# Patient Record
Sex: Male | Born: 1960
Health system: Southern US, Community
[De-identification: ages and names within clinical notes are randomized; demographics above are authoritative.]

## PROBLEM LIST (undated history)

## (undated) DIAGNOSIS — I10 Essential (primary) hypertension: Secondary | ICD-10-CM

## (undated) DIAGNOSIS — E559 Vitamin D deficiency, unspecified: Secondary | ICD-10-CM

## (undated) DIAGNOSIS — E291 Testicular hypofunction: Secondary | ICD-10-CM

## (undated) DIAGNOSIS — H6990 Unspecified Eustachian tube disorder, unspecified ear: Secondary | ICD-10-CM

## (undated) HISTORY — DX: Unspecified eustachian tube disorder, unspecified ear: H69.90

## (undated) HISTORY — DX: Essential (primary) hypertension: I10

## (undated) HISTORY — DX: Vitamin D deficiency, unspecified: E55.9

## (undated) HISTORY — DX: Testicular hypofunction: E29.1

---
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
## 1999-03-13 HISTORY — PX: APPENDECTOMY: SHX54

## 2001-03-12 HISTORY — PX: NASAL SINUS SURGERY: SHX719

## 2005-06-06 ENCOUNTER — Other Ambulatory Visit: Payer: Self-pay

## 2005-06-06 ENCOUNTER — Emergency Department: Payer: Self-pay | Admitting: Internal Medicine

## 2005-06-08 DIAGNOSIS — J301 Allergic rhinitis due to pollen: Secondary | ICD-10-CM | POA: Insufficient documentation

## 2007-08-01 DIAGNOSIS — L259 Unspecified contact dermatitis, unspecified cause: Secondary | ICD-10-CM | POA: Insufficient documentation

## 2008-02-17 DIAGNOSIS — H6990 Unspecified Eustachian tube disorder, unspecified ear: Secondary | ICD-10-CM

## 2008-02-17 HISTORY — DX: Unspecified eustachian tube disorder, unspecified ear: H69.90

## 2008-04-26 DIAGNOSIS — Z8249 Family history of ischemic heart disease and other diseases of the circulatory system: Secondary | ICD-10-CM | POA: Insufficient documentation

## 2008-04-26 DIAGNOSIS — I10 Essential (primary) hypertension: Secondary | ICD-10-CM | POA: Insufficient documentation

## 2008-04-28 DIAGNOSIS — E7800 Pure hypercholesterolemia, unspecified: Secondary | ICD-10-CM | POA: Insufficient documentation

## 2009-03-12 DIAGNOSIS — E291 Testicular hypofunction: Secondary | ICD-10-CM

## 2009-03-12 HISTORY — DX: Testicular hypofunction: E29.1

## 2009-08-08 ENCOUNTER — Emergency Department: Payer: Self-pay | Admitting: Emergency Medicine

## 2009-08-09 ENCOUNTER — Ambulatory Visit: Payer: Self-pay | Admitting: Family Medicine

## 2009-12-07 ENCOUNTER — Emergency Department (HOSPITAL_COMMUNITY): Admission: EM | Admit: 2009-12-07 | Discharge: 2009-12-07 | Payer: Self-pay | Admitting: Emergency Medicine

## 2010-02-14 ENCOUNTER — Emergency Department: Payer: Self-pay | Admitting: Internal Medicine

## 2010-03-01 ENCOUNTER — Ambulatory Visit: Payer: Self-pay | Admitting: Family Medicine

## 2010-05-08 ENCOUNTER — Ambulatory Visit: Payer: Self-pay | Admitting: Family Medicine

## 2010-08-26 ENCOUNTER — Other Ambulatory Visit: Payer: Self-pay | Admitting: Gastroenterology

## 2010-08-26 DIAGNOSIS — K648 Other hemorrhoids: Secondary | ICD-10-CM

## 2010-08-26 MED ORDER — HYDROCORTISONE ACETATE 25 MG RE SUPP: 25.0000 mg | Freq: Two times a day (BID) | RECTAL | Status: AC

## 2015-07-29 ENCOUNTER — Ambulatory Visit
Admission: RE | Admit: 2015-07-29 | Discharge: 2015-07-29 | Disposition: A | Discharge status: Home / Self Care | Payer: Federal, State, Local not specified - PPO | Source: Ambulatory Visit | Attending: Family Medicine | Admitting: Family Medicine

## 2015-07-29 ENCOUNTER — Encounter: Payer: Self-pay | Admitting: *Deleted

## 2015-07-29 ENCOUNTER — Ambulatory Visit (INDEPENDENT_AMBULATORY_CARE_PROVIDER_SITE_OTHER): Payer: Federal, State, Local not specified - PPO | Admitting: Family Medicine

## 2015-07-29 ENCOUNTER — Encounter: Payer: Self-pay | Admitting: Family Medicine

## 2015-07-29 ENCOUNTER — Other Ambulatory Visit: Payer: Self-pay | Admitting: *Deleted

## 2015-07-29 VITALS — BP 144/90 | HR 90 | Temp 98.4°F | Resp 16 | Wt 208.0 lb

## 2015-07-29 DIAGNOSIS — J301 Allergic rhinitis due to pollen: Secondary | ICD-10-CM | POA: Diagnosis not present

## 2015-07-29 DIAGNOSIS — R109 Unspecified abdominal pain: Secondary | ICD-10-CM

## 2015-07-29 DIAGNOSIS — R42 Dizziness and giddiness: Secondary | ICD-10-CM | POA: Insufficient documentation

## 2015-07-29 DIAGNOSIS — E559 Vitamin D deficiency, unspecified: Secondary | ICD-10-CM | POA: Insufficient documentation

## 2015-07-29 DIAGNOSIS — R079 Chest pain, unspecified: Secondary | ICD-10-CM | POA: Insufficient documentation

## 2015-07-29 DIAGNOSIS — E291 Testicular hypofunction: Secondary | ICD-10-CM

## 2015-07-29 DIAGNOSIS — R1012 Left upper quadrant pain: Secondary | ICD-10-CM | POA: Insufficient documentation

## 2015-07-29 DIAGNOSIS — I10 Essential (primary) hypertension: Secondary | ICD-10-CM | POA: Insufficient documentation

## 2015-07-29 DIAGNOSIS — J329 Chronic sinusitis, unspecified: Secondary | ICD-10-CM | POA: Diagnosis not present

## 2015-07-29 DIAGNOSIS — G4710 Hypersomnia, unspecified: Secondary | ICD-10-CM | POA: Insufficient documentation

## 2015-07-29 DIAGNOSIS — R10A0 Flank pain, unspecified side: Secondary | ICD-10-CM

## 2015-07-29 LAB — POCT URINALYSIS DIPSTICK
BILIRUBIN UA: NEGATIVE
Glucose, UA: NEGATIVE
Leukocytes, UA: NEGATIVE
Nitrite, UA: NEGATIVE
PH UA: 7.5
Spec Grav, UA: 1.01
Urobilinogen, UA: NEGATIVE

## 2015-07-29 MED ORDER — PREDNISONE 10 MG PO TABS: ORAL_TABLET | ORAL | Status: AC

## 2015-07-29 MED ORDER — AMOXICILLIN 500 MG PO CAPS: 1000.0000 mg | ORAL_CAPSULE | Freq: Two times a day (BID) | ORAL | Status: DC

## 2015-07-29 NOTE — Patient Instructions (Signed)
Go to the [facility] on [address] for Abdominal Xray

## 2015-07-29 NOTE — Progress Notes (Signed)
Patient: Alex Patterson Male    DOB: 1960/12/08   55 y.o.   MRN: [id number] Visit Date: 07/29/2015  Today's Provider: Mila Merryonald Miquel Stacks, MD   Chief Complaint  Patient presents with  . Sinusitis  . Fever  . Sore Throat   Subjective:    Sinusitis This is a new problem. The current episode started in the past 7 days (5 days). The problem has been gradually worsening since onset. There has been no fever. Associated symptoms include congestion, coughing and sinus pressure. Pertinent negatives include no chills, diaphoresis, shortness of breath or sneezing. Past treatments include spray decongestants (decongestant). The treatment provided mild relief.  Patient has had sinus pressure for 5 days. Symptoms of sore throat, sore neck, cough, and flushing. Has history of allergies and just started back on Flonase and Claritin this week.    Patient has also had left flank pain for a few weeks. Always seems to be a dull pain in left side.A little worse with activity. Not related to eating or drinking. Had lipoma excised from area several years ago.    No Known Allergies Previous Medications   CETIRIZINE (ZYRTEC ALLERGY) 10 MG TABLET    Take 1 tablet by mouth daily. Reported on 07/29/2015   FLUTICASONE (FLONASE ALLERGY RELIEF) 50 MCG/ACT NASAL SPRAY    Place 2 sprays into the nose daily.    Review of Systems  Constitutional: Negative for chills, diaphoresis and appetite change.  HENT: Positive for congestion, postnasal drip, sinus pressure and tinnitus. Negative for sneezing and trouble swallowing.   Respiratory: Positive for cough. Negative for chest tightness, shortness of breath and wheezing.   Cardiovascular: Negative for palpitations.    Social History  Substance Use Topics  . Smoking status: Never Smoker   . Smokeless tobacco: Not on file  . Alcohol Use: No   Objective:   BP 144/90 mmHg  Pulse 90  Temp(Src) 98.4 F (36.9 C) (Oral)  Resp 16  Wt 208 lb (94.348 kg)  SpO2  98%  Physical Exam  General Appearance:    Alert, cooperative, no distress  HENT:   bilateral TM normal without fluid or infection, neck without nodes, pharynx erythematous without exudate, post nasal drip noted and nasal mucosa congested  Eyes:    PERRL, conjunctiva/corneas clear, EOM's intact       Lungs:     Clear to auscultation bilaterally, respirations unlabored  Heart:    Regular rate and rhythm  Neurologic:   Awake, alert, oriented x 3. No apparent focal neurological           defect.    Abdomen:   bowel sounds present and normal in all 4 quadrants. No CVA tenderness. Slight tenderness of intercostal area around 7th-9th ribs on left side. No swelling, erythema, or other gross deformities.    Results for orders placed or performed in visit on 07/29/15  POCT urinalysis dipstick  Result Value Ref Range   Color, UA Amber    Clarity, UA Clear    Glucose, UA Neg    Bilirubin, UA Neg    Ketones, UA Trace    Spec Grav, UA 1.010    Blood, UA Trace    pH, UA 7.5    Protein, UA Trace    Urobilinogen, UA negative    Nitrite, UA Neg    Leukocytes, UA Negative Negative        Assessment & Plan:     1. Flank pain Trace or urine  on u/a. Need to rule out stone and intrinsic kidney disease. Consider u/s if KUB is normal. Most likely explanation is intercostal muscle strain - POCT urinalysis dipstick - DG Abd 1 View; Future  2. Allergic rhinitis due to pollen, unspecified rhinitis seasonality Recently back on Claritin, put on short course of prednisone - predniSONE (DELTASONE) 10 MG tablet; 6 tablets for 1 day, then 5 for 1 day, then 4 for 1 day, then 3 for 1 day, then 2 for 1 day then 1 for 1 day.  Dispense: 21 tablet; Refill: 0  3. Sinusitis, unspecified chronicity, unspecified location Given option of starting antibiotic now, or waiting to see if sx clear with prednisone as above.  - amoxicillin (AMOXIL) 500 MG capsule; Take 2 capsules (1,000 mg total) by mouth 2 (two) times  daily.  Dispense: 28 capsule; Refill: 0      The entirety of the information documented in the History of Present Illness, Review of Systems and Physical Exam were personally obtained by me. Portions of this information were initially documented by April M. Hyacinth Meeker, CMA and reviewed by me for thoroughness and accuracy.   Mila Merry, MD  [facility]

## 2015-08-01 ENCOUNTER — Other Ambulatory Visit: Payer: Self-pay

## 2015-08-01 DIAGNOSIS — R109 Unspecified abdominal pain: Secondary | ICD-10-CM

## 2015-08-01 DIAGNOSIS — R1012 Left upper quadrant pain: Secondary | ICD-10-CM

## 2015-08-01 NOTE — Progress Notes (Signed)
Patient advised and order put in chart and sent to referrals.

## 2015-08-04 ENCOUNTER — Ambulatory Visit
Admission: RE | Admit: 2015-08-04 | Discharge: 2015-08-04 | Disposition: A | Discharge status: Home / Self Care | Payer: Federal, State, Local not specified - PPO | Source: Ambulatory Visit | Attending: Family Medicine | Admitting: Family Medicine

## 2015-08-04 ENCOUNTER — Telehealth: Payer: Self-pay

## 2015-08-04 DIAGNOSIS — R1012 Left upper quadrant pain: Secondary | ICD-10-CM

## 2015-08-04 DIAGNOSIS — R109 Unspecified abdominal pain: Secondary | ICD-10-CM | POA: Insufficient documentation

## 2015-08-04 NOTE — Telephone Encounter (Signed)
Left message to call back  

## 2015-08-04 NOTE — Telephone Encounter (Signed)
-----   Message from Malva Limesonald E Fisher, MD sent at 08/04/2015  1:12 PM EDT ----- Everything is normal on ultrasound. Normal gallbladder, liver, spleen, stomach and kidneys. Pain is most like muscle or tendon strain and should resolve in a few weeks. Let me know if it doesn't

## 2015-08-05 NOTE — Telephone Encounter (Signed)
Pt advised-aa 

## 2015-08-11 ENCOUNTER — Encounter: Payer: Self-pay | Admitting: Family Medicine

## 2016-10-14 ENCOUNTER — Ambulatory Visit
Admission: EM | Admit: 2016-10-14 | Discharge: 2016-10-14 | Discharge status: Against Medical Advice | Payer: Federal, State, Local not specified - PPO

## 2016-10-15 DIAGNOSIS — H43812 Vitreous degeneration, left eye: Secondary | ICD-10-CM | POA: Diagnosis not present

## 2017-02-11 ENCOUNTER — Telehealth: Payer: Self-pay | Admitting: Family Medicine

## 2017-03-13 ENCOUNTER — Encounter: Payer: Self-pay | Admitting: Family Medicine

## 2017-03-13 ENCOUNTER — Ambulatory Visit (INDEPENDENT_AMBULATORY_CARE_PROVIDER_SITE_OTHER): Payer: Federal, State, Local not specified - PPO | Admitting: Family Medicine

## 2017-03-13 VITALS — BP 144/88 | HR 60 | Temp 98.5°F | Resp 16 | Ht 67.0 in | Wt 208.0 lb

## 2017-03-13 DIAGNOSIS — Z23 Encounter for immunization: Secondary | ICD-10-CM | POA: Diagnosis not present

## 2017-03-13 DIAGNOSIS — E291 Testicular hypofunction: Secondary | ICD-10-CM | POA: Diagnosis not present

## 2017-03-13 DIAGNOSIS — Z0000 Encounter for general adult medical examination without abnormal findings: Secondary | ICD-10-CM

## 2017-03-13 DIAGNOSIS — R3915 Urgency of urination: Secondary | ICD-10-CM

## 2017-03-13 DIAGNOSIS — R030 Elevated blood-pressure reading, without diagnosis of hypertension: Secondary | ICD-10-CM | POA: Diagnosis not present

## 2017-03-13 DIAGNOSIS — Z1159 Encounter for screening for other viral diseases: Secondary | ICD-10-CM | POA: Diagnosis not present

## 2017-03-13 DIAGNOSIS — N529 Male erectile dysfunction, unspecified: Secondary | ICD-10-CM | POA: Diagnosis not present

## 2017-03-13 DIAGNOSIS — Z1211 Encounter for screening for malignant neoplasm of colon: Secondary | ICD-10-CM

## 2017-03-13 LAB — POCT URINALYSIS DIPSTICK
Bilirubin, UA: NEGATIVE
GLUCOSE UA: NEGATIVE
Ketones, UA: NEGATIVE
LEUKOCYTES UA: NEGATIVE
NITRITE UA: NEGATIVE
PROTEIN UA: NEGATIVE
RBC UA: NEGATIVE
SPEC GRAV UA: 1.025 (ref 1.010–1.025)
Urobilinogen, UA: 0.2 E.U./dL
pH, UA: 6.5 (ref 5.0–8.0)

## 2017-03-13 MED ORDER — SILDENAFIL CITRATE 50 MG PO TABS: 50.0000 mg | ORAL_TABLET | Freq: Every day | ORAL | 2 refills | Status: DC | PRN

## 2017-03-13 NOTE — Progress Notes (Signed)
Patient: Alex Patterson, Male    DOB: June 03, 1960, 57 y.o.   MRN: [id number] Visit Date: 03/13/2017  Today's Provider: Mila Merry, MD   Chief Complaint  Patient presents with  . Annual Exam  . Allergic Rhinitis    Subjective:    Annual physical exam Alex Patterson is a 57 y.o. male who presents today for health maintenance and complete physical. He feels fairly well. He reports exercising not regularly, but he does stay active. He reports he is sleeping well.   He is interested in medication for ED. He has history of low testosterone and had brief trial of testosterone gel several years ago. He didn't notice much difference in how he felt when he was taking it, but was thinking about trying injectable testosterone instead. Otherwise feel well. Feels allergies are adequately controlled with prn use of OTC nasal steroids and antihistamines.    Review of Systems  Constitutional: Negative.  Negative for chills, diaphoresis and fever.  HENT: Negative.  Negative for congestion, ear discharge, ear pain, hearing loss, nosebleeds, sore throat and tinnitus.   Eyes: Negative.  Negative for photophobia, pain, discharge and redness.  Respiratory: Negative.  Negative for cough, shortness of breath, wheezing and stridor.   Cardiovascular: Negative.  Negative for chest pain, palpitations and leg swelling.  Gastrointestinal: Negative.  Negative for abdominal pain, blood in stool, constipation, diarrhea, nausea and vomiting.  Endocrine: Negative.  Negative for polydipsia.  Genitourinary: Positive for urgency (no nocturia). Negative for difficulty urinating, dysuria, flank pain, frequency and hematuria.  Musculoskeletal: Negative for back pain, myalgias and neck pain.  Skin: Negative.  Negative for rash.  Allergic/Immunologic: Negative.  Negative for environmental allergies.  Neurological: Negative.  Negative for dizziness, tremors, seizures, weakness and headaches.  Hematological:  Negative.  Does not bruise/bleed easily.  Psychiatric/Behavioral: Negative.  Negative for hallucinations and suicidal ideas. The patient is not nervous/anxious.     Social History      He  reports that  has never smoked. he has never used smokeless tobacco. He reports that he does not drink alcohol or use drugs.       Social History   Socioeconomic History  . Marital status: Married    Spouse name: None  . Number of children: None  . Years of education: [employer]  . Highest education level: None  Social Needs  . Financial resource strain: None  . Food insecurity - worry: None  . Food insecurity - inability: None  . Transportation needs - medical: None  . Transportation needs - non-medical: None  Occupational History  . Occupation: Audiological scientist  Tobacco Use  . Smoking status: Never Smoker  . Smokeless tobacco: Never Used  Substance and Sexual Activity  . Alcohol use: No  . Drug use: No  . Sexual activity: None  Other Topics Concern  . None  Social History Narrative  . None    Past Medical History:  Diagnosis Date  . Disorder of eustachian tube   . Disorder of eustachian tube 02/17/2008  . Hypogonadism male   . Hypogonadism male 03/12/2009   04/25/10 Testosterone, Serum=214 ng/dL (Abn: L) [id number] Free Testosterone(Direct)= 3.9 pg/mL (abn: L) n=6.8-21.5    . Vitamin D deficiency      Patient Active Problem List   Diagnosis Date Noted  . Vitamin D deficiency 07/29/2015  . Pure hypercholesterolemia 04/28/2008  . Family history of ischemic heart disease 04/26/2008  . Elevated blood-pressure reading without diagnosis of  hypertension 04/26/2008  . Allergic rhinitis due to pollen 06/08/2005    Past Surgical History:  Procedure Laterality Date  . APPENDECTOMY  2001  . NASAL SINUS SURGERY  2003  . undescended left testicle  1973    Family History        Family Status  Relation Name Status  . Mother  Deceased  . Father  Deceased  . Sister  Alive         His family history includes Lung cancer in his mother; Stroke in his father.     No Known Allergies   Current Outpatient Medications:  .  cetirizine (ZYRTEC ALLERGY) 10 MG tablet, Take 1 tablet by mouth daily. Reported on 07/29/2015, Disp: , Rfl:  .  fluticasone (FLONASE ALLERGY RELIEF) 50 MCG/ACT nasal spray, Place 2 sprays into the nose daily., Disp: , Rfl:    Patient Care Team: Malva LimesFisher, Donald E, MD as PCP - General (Family Medicine)      Objective:   Vitals: BP (!) 144/88 (BP Location: Right Arm, Patient Position: Sitting, Cuff Size: Normal)   Pulse 60   Temp 98.5 F (36.9 C)   Resp 16   Ht 5\' 7"  (1.702 m)   Wt 208 lb (94.3 kg)   SpO2 97%   BMI 32.58 kg/m    Vitals:   03/13/17 1006  BP: (!) 144/88  Pulse: 60  Resp: 16  Temp: 98.5 F (36.9 C)  SpO2: 97%  Weight: 208 lb (94.3 kg)  Height: 5\' 7"  (1.702 m)     Physical Exam   General Appearance:    Alert, cooperative, no distress, appears stated age, overweight  Head:    Normocephalic, without obvious abnormality, atraumatic  Eyes:    PERRL, conjunctiva/corneas clear, EOM's intact, fundi    benign, both eyes       Ears:    Normal TM's and external ear canals, both ears  Nose:   Nares normal, septum midline, mucosa normal, no drainage   or sinus tenderness  Throat:   Lips, mucosa, and tongue normal; teeth and gums normal  Neck:   Supple, symmetrical, trachea midline, no adenopathy;       thyroid:  No enlargement/tenderness/nodules; no carotid   bruit or JVD  Back:     Symmetric, no curvature, ROM normal, no CVA tenderness  Lungs:     Clear to auscultation bilaterally, respirations unlabored  Chest wall:    No tenderness or deformity  Heart:    Regular rate and rhythm, S1 and S2 normal, no murmur, rub   or gallop  Abdomen:     Soft, non-tender, bowel sounds active all four quadrants,    no masses, no organomegaly  Genitalia:    deferred  Rectal:    deferred  Extremities:   Extremities normal, atraumatic, no  cyanosis or edema  Pulses:   2+ and symmetric all extremities  Skin:   Skin color, texture, turgor normal, no rashes or lesions  Lymph nodes:   Cervical, supraclavicular, and axillary nodes normal  Neurologic:   CNII-XII intact. Normal strength, sensation and reflexes      throughout    Depression Screen PHQ 2/9 Scores 03/13/2017  PHQ - 2 Score 0      Assessment & Plan:     Routine Health Maintenance and Physical Exam  Exercise Activities and Dietary recommendations Goals    None      Immunization History  Administered Date(s) Administered  . Td 04/12/1996  . Tdap 03/19/2011  Health Maintenance  Topic Date Due  . Hepatitis C Screening  05/27/1960  . HIV Screening  03/10/1978  . COLONOSCOPY  03/10/2013  . INFLUENZA VACCINE  06/09/2017 (Originally 10/10/2016)  . TETANUS/TDAP  03/18/2021     Discussed health benefits of physical activity, and encouraged him to engage in regular exercise appropriate for his age and condition.     1. Annual physical exam  - POCT urinalysis dipstick - Lipid panel - PSA - TSH - Comprehensive metabolic panel  2. Urinary urgency He is not particularly bothered by this, feels it is normal for his age.  - POCT urinalysis dipstick  3. Hypogonadism male Previously on androgel, but he prefers to try injectable testosterone if levels are still low.  - Testosterone,Free and Total  4. Colon cancer screening  - Ambulatory referral to Gastroenterology  5. Elevated blood pressure reading Counseled regarding prudent diet and regular exercise. Try to lose 10% of bodyweight over the next 6 months.  If labs are normal should return in 4-6 months for BP check.  - Lipid panel - PSA - TSH - Comprehensive metabolic panel  6. Need for hepatitis C screening test  - Hepatitis C antibody  7. Need for shingles vaccine  - Varicella-zoster vaccine IM Return 2 months for Shingrix #2.   8. ED Try sildenafil 50mg  1-2 daily prn.   Mila Merry, MD  [facility]

## 2017-03-13 NOTE — Patient Instructions (Addendum)
I recommend that you get a flu vaccine this year. Please call our office at [phone] at your earliest convenience to schedule a flu shot.   You should see a 10 point drop in your blood pressure if you can lose 5-10% of you bodyweight.   Preventive Care 40-64 Years, Male Preventive care refers to lifestyle choices and visits with your health care provider that can promote health and wellness. What does preventive care include?  A yearly physical exam. This is also called an annual well check.  Dental exams once or twice a year.  Routine eye exams. Ask your health care provider how often you should have your eyes checked.  Personal lifestyle choices, including: ? Daily care of your teeth and gums. ? Regular physical activity. ? Eating a healthy diet. ? Avoiding tobacco and drug use. ? Limiting alcohol use. ? Practicing safe sex. ? Taking low-dose aspirin every day starting at age 46. What happens during an annual well check? The services and screenings done by your health care provider during your annual well check will depend on your age, overall health, lifestyle risk factors, and family history of disease. Counseling Your health care provider may ask you questions about your:  Alcohol use.  Tobacco use.  Drug use.  Emotional well-being.  Home and relationship well-being.  Sexual activity.  Eating habits.  Work and work Statistician.  Screening You may have the following tests or measurements:  Height, weight, and BMI.  Blood pressure.  Lipid and cholesterol levels. These may be checked every 5 years, or more frequently if you are over 69 years old.  Skin check.  Lung cancer screening. You may have this screening every year starting at age 15 if you have a 30-pack-year history of smoking and currently smoke or have quit within the past 15 years.  Fecal occult blood test (FOBT) of the stool. You may have this test every year starting at age 90.  Flexible  sigmoidoscopy or colonoscopy. You may have a sigmoidoscopy every 5 years or a colonoscopy every 10 years starting at age 46.  Prostate cancer screening. Recommendations will vary depending on your family history and other risks.  Hepatitis C blood test.  Hepatitis B blood test.  Sexually transmitted disease (STD) testing.  Diabetes screening. This is done by checking your blood sugar (glucose) after you have not eaten for a while (fasting). You may have this done every 1-3 years.  Discuss your test results, treatment options, and if necessary, the need for more tests with your health care provider. Vaccines Your health care provider may recommend certain vaccines, such as:  Influenza vaccine. This is recommended every year.  Tetanus, diphtheria, and acellular pertussis (Tdap, Td) vaccine. You may need a Td booster every 10 years.  Varicella vaccine. You may need this if you have not been vaccinated.  Zoster vaccine. You may need this after age 67.  Measles, mumps, and rubella (MMR) vaccine. You may need at least one dose of MMR if you were born in 1957 or later. You may also need a second dose.  Pneumococcal 13-valent conjugate (PCV13) vaccine. You may need this if you have certain conditions and have not been vaccinated.  Pneumococcal polysaccharide (PPSV23) vaccine. You may need one or two doses if you smoke cigarettes or if you have certain conditions.  Meningococcal vaccine. You may need this if you have certain conditions.  Hepatitis A vaccine. You may need this if you have certain conditions or if you  travel or work in places where you may be exposed to hepatitis A.  Hepatitis B vaccine. You may need this if you have certain conditions or if you travel or work in places where you may be exposed to hepatitis B.  Haemophilus influenzae type b (Hib) vaccine. You may need this if you have certain risk factors.  Talk to your health care provider about which screenings and  vaccines you need and how often you need them. This information is not intended to replace advice given to you by your health care provider. Make sure you discuss any questions you have with your health care provider. Document Released: 03/25/2015 Document Revised: 11/16/2015 Document Reviewed: 12/28/2014 Elsevier Interactive Patient Education  [employer].

## 2017-03-14 DIAGNOSIS — R030 Elevated blood-pressure reading, without diagnosis of hypertension: Secondary | ICD-10-CM | POA: Diagnosis not present

## 2017-03-14 DIAGNOSIS — E291 Testicular hypofunction: Secondary | ICD-10-CM | POA: Diagnosis not present

## 2017-03-14 DIAGNOSIS — Z1159 Encounter for screening for other viral diseases: Secondary | ICD-10-CM | POA: Diagnosis not present

## 2017-03-14 DIAGNOSIS — Z0000 Encounter for general adult medical examination without abnormal findings: Secondary | ICD-10-CM | POA: Diagnosis not present

## 2017-03-15 LAB — COMPREHENSIVE METABOLIC PANEL
ALK PHOS: 72 IU/L (ref 39–117)
ALT: 23 IU/L (ref 0–44)
AST: 20 IU/L (ref 0–40)
Albumin/Globulin Ratio: 2 (ref 1.2–2.2)
Albumin: 4.3 g/dL (ref 3.5–5.5)
BILIRUBIN TOTAL: 0.5 mg/dL (ref 0.0–1.2)
BUN/Creatinine Ratio: 13 (ref 9–20)
BUN: 14 mg/dL (ref 6–24)
CHLORIDE: 104 mmol/L (ref 96–106)
CO2: 21 mmol/L (ref 20–29)
Calcium: 9.1 mg/dL (ref 8.7–10.2)
Creatinine, Ser: 1.12 mg/dL (ref 0.76–1.27)
GFR calc Af Amer: 86 mL/min/{1.73_m2} (ref >59)
GFR calc non Af Amer: 74 mL/min/{1.73_m2} (ref >59)
GLOBULIN, TOTAL: 2.2 g/dL (ref 1.5–4.5)
GLUCOSE: 105 mg/dL — AB (ref 65–99)
Potassium: 4 mmol/L (ref 3.5–5.2)
SODIUM: 142 mmol/L (ref 134–144)
Total Protein: 6.5 g/dL (ref 6.0–8.5)

## 2017-03-15 LAB — LIPID PANEL
Chol/HDL Ratio: 4 ratio (ref 0.0–5.0)
Cholesterol, Total: 214 mg/dL — ABNORMAL HIGH (ref 100–199)
HDL: 53 mg/dL (ref >39)
LDL CALC: 149 mg/dL — AB (ref 0–99)
Triglycerides: 61 mg/dL (ref 0–149)
VLDL Cholesterol Cal: 12 mg/dL (ref 5–40)

## 2017-03-15 LAB — PSA: PROSTATE SPECIFIC AG, SERUM: 1.9 ng/mL (ref 0.0–4.0)

## 2017-03-15 LAB — TSH: TSH: 3.02 u[IU]/mL (ref 0.450–4.500)

## 2017-03-15 LAB — TESTOSTERONE,FREE AND TOTAL
TESTOSTERONE: 406 ng/dL (ref 264–916)
Testosterone, Free: 7.2 pg/mL (ref 7.2–24.0)

## 2017-03-15 LAB — HEPATITIS C ANTIBODY

## 2017-03-19 ENCOUNTER — Encounter: Payer: Self-pay | Admitting: Family Medicine

## 2017-03-19 DIAGNOSIS — N529 Male erectile dysfunction, unspecified: Secondary | ICD-10-CM | POA: Insufficient documentation

## 2017-04-01 ENCOUNTER — Telehealth: Payer: Self-pay

## 2017-04-01 NOTE — Telephone Encounter (Signed)
Gastroenterology Pre-Procedure Review  Request Date:   Requesting Physician: Dr.    PATIENT REVIEW QUESTIONS: The patient responded to the following health history questions as indicated:    1. Are you having any GI issues? No  2. Do you have a personal history of Polyps? No  3. Do you have a family history of Colon Cancer or Polyps? No  4. Diabetes Mellitus? No  5. Joint replacements in the past 12 months? No  6. Major health problems in the past 3 months? No  7. Any artificial heart valves, MVP, or defibrillator? No     MEDICATIONS & ALLERGIES:    Patient reports the following regarding taking any anticoagulation/antiplatelet therapy:   Plavix, Coumadin, Eliquis, Xarelto, Lovenox, Pradaxa, Brilinta, or Effient? No  Aspirin? No   Patient confirms/reports the following medications:  Current Outpatient Medications  Medication Sig Dispense Refill  . cetirizine (ZYRTEC ALLERGY) 10 MG tablet Take 1 tablet by mouth daily. Reported on 07/29/2015    . fluticasone (FLONASE ALLERGY RELIEF) 50 MCG/ACT nasal spray Place 2 sprays into the nose daily.    . sildenafil (VIAGRA) 50 MG tablet Take 1-2 tablets (50-100 mg total) by mouth daily as needed. 8 tablet 2   No current facility-administered medications for this visit.     Patient confirms/reports the following allergies:  No Known Allergies  No orders of the defined types were placed in this encounter.   AUTHORIZATION INFORMATION Primary Insurance: 1D#: Group #:  Secondary Insurance: 1D#: Group #:  SCHEDULE INFORMATION: Date: 04/23/17 Time: Location: [facility]

## 2017-04-03 ENCOUNTER — Other Ambulatory Visit: Payer: Self-pay

## 2017-04-03 DIAGNOSIS — Z1211 Encounter for screening for malignant neoplasm of colon: Secondary | ICD-10-CM

## 2017-05-10 ENCOUNTER — Ambulatory Visit
Admission: RE | Admit: 2017-05-10 | Discharge: 2017-05-10 | Disposition: A | Discharge status: Home / Self Care | Payer: Federal, State, Local not specified - PPO | Source: Ambulatory Visit | Attending: Gastroenterology | Admitting: Gastroenterology

## 2017-05-10 ENCOUNTER — Emergency Department
Admission: EM | Admit: 2017-05-10 | Discharge: 2017-05-10 | Disposition: A | Discharge status: Home / Self Care | Payer: Federal, State, Local not specified - PPO | Source: Home / Self Care | Attending: Emergency Medicine | Admitting: Emergency Medicine

## 2017-05-10 ENCOUNTER — Other Ambulatory Visit: Payer: Self-pay

## 2017-05-10 ENCOUNTER — Encounter
Admission: RE | Disposition: A | Discharge status: Home / Self Care | Payer: Self-pay | Source: Ambulatory Visit | Attending: Gastroenterology

## 2017-05-10 ENCOUNTER — Encounter: Payer: Self-pay | Admitting: *Deleted

## 2017-05-10 ENCOUNTER — Encounter: Payer: Self-pay | Admitting: Emergency Medicine

## 2017-05-10 DIAGNOSIS — I10 Essential (primary) hypertension: Secondary | ICD-10-CM

## 2017-05-10 DIAGNOSIS — Z5309 Procedure and treatment not carried out because of other contraindication: Secondary | ICD-10-CM | POA: Diagnosis not present

## 2017-05-10 DIAGNOSIS — Z8249 Family history of ischemic heart disease and other diseases of the circulatory system: Secondary | ICD-10-CM | POA: Insufficient documentation

## 2017-05-10 DIAGNOSIS — Z1211 Encounter for screening for malignant neoplasm of colon: Secondary | ICD-10-CM | POA: Diagnosis not present

## 2017-05-10 LAB — CBC WITH DIFFERENTIAL/PLATELET
Basophils Absolute: 0 10*3/uL (ref 0–0.1)
Basophils Relative: 1 %
EOS ABS: 0.1 10*3/uL (ref 0–0.7)
Eosinophils Relative: 1 %
HEMATOCRIT: 42.4 % (ref 40.0–52.0)
HEMOGLOBIN: 14.4 g/dL (ref 13.0–18.0)
LYMPHS ABS: 1.3 10*3/uL (ref 1.0–3.6)
LYMPHS PCT: 24 %
MCH: 27.6 pg (ref 26.0–34.0)
MCHC: 34.1 g/dL (ref 32.0–36.0)
MCV: 81 fL (ref 80.0–100.0)
MONOS PCT: 7 %
Monocytes Absolute: 0.4 10*3/uL (ref 0.2–1.0)
NEUTROS ABS: 3.5 10*3/uL (ref 1.4–6.5)
NEUTROS PCT: 67 %
Platelets: 215 10*3/uL (ref 150–440)
RBC: 5.23 MIL/uL (ref 4.40–5.90)
RDW: 13.2 % (ref 11.5–14.5)
WBC: 5.3 10*3/uL (ref 3.8–10.6)

## 2017-05-10 LAB — COMPREHENSIVE METABOLIC PANEL
ALK PHOS: 69 U/L (ref 38–126)
ALT: 22 U/L (ref 17–63)
ANION GAP: 8 (ref 5–15)
AST: 24 U/L (ref 15–41)
Albumin: 4 g/dL (ref 3.5–5.0)
BUN: 10 mg/dL (ref 6–20)
CALCIUM: 8.5 mg/dL — AB (ref 8.9–10.3)
CO2: 25 mmol/L (ref 22–32)
Chloride: 108 mmol/L (ref 101–111)
Creatinine, Ser: 0.99 mg/dL (ref 0.61–1.24)
Glucose, Bld: 94 mg/dL (ref 65–99)
Potassium: 4 mmol/L (ref 3.5–5.1)
Sodium: 141 mmol/L (ref 135–145)
TOTAL PROTEIN: 7.1 g/dL (ref 6.5–8.1)
Total Bilirubin: 0.9 mg/dL (ref 0.3–1.2)

## 2017-05-10 LAB — TROPONIN I: Troponin I: <0.03 ng/mL (ref <0.03)

## 2017-05-10 SURGERY — COLONOSCOPY WITH PROPOFOL
Anesthesia: General

## 2017-05-10 MED ORDER — MIDAZOLAM HCL 2 MG/2ML IJ SOLN
INTRAMUSCULAR | Status: AC
  Filled 2017-05-10: qty 2

## 2017-05-10 MED ORDER — SODIUM CHLORIDE 0.9 % IV SOLN
INTRAVENOUS | Status: DC
  Administered 2017-05-10: 10:00:00 via INTRAVENOUS

## 2017-05-10 MED ORDER — MIDAZOLAM HCL 2 MG/2ML IJ SOLN: 2.0000 mg | Freq: Once | INTRAMUSCULAR | Status: DC

## 2017-05-10 MED ORDER — AMLODIPINE BESYLATE 5 MG PO TABS
5.0000 mg | ORAL_TABLET | Freq: Once | ORAL | Status: AC
  Administered 2017-05-10: 5 mg via ORAL
  Filled 2017-05-10: qty 1

## 2017-05-10 MED ORDER — AMLODIPINE BESYLATE 5 MG PO TABS: 5.0000 mg | ORAL_TABLET | Freq: Every day | ORAL | 1 refills | Status: DC

## 2017-05-10 NOTE — ED Notes (Signed)
First nurse note   Presents with elevated b/p from Gi

## 2017-05-10 NOTE — ED Notes (Signed)
Pt stating that he has had issues with his BP in the past but was taken off his medications because it was better. Pt denying any CP, HA, or visual changes. Pt was sent here after having a high BP reading in procedures. Pt was supposed to have a colonoscopy today but they sent him here "because it was to dangerous" to have the procedure done today. Pt in NAD at this time.

## 2017-05-10 NOTE — Discharge Instructions (Signed)
As we discussed, though you do have high blood pressure (hypertension), fortunately it is not immediately dangerous at this time and does not need emergency intervention or admission to the hospital.  Given that it has been consistently higher than your usual blood pressures, we discussed it and agree to start you on a blood pressure medication called amlodipine.  Please take it as prescribed and follow up in clinic as recommended in these papers.    Return to the Emergency Department (ED) if you experience any worsening chest pain/pressure/tightness, difficulty breathing, or sudden sweating, or other symptoms that concern you.

## 2017-05-10 NOTE — ED Triage Notes (Signed)
Pt here from endo.  Was there for routine colonoscopy and when they took his BP was elevated and was 213 systolic so they sent pt to ED.  Pt denies any symptoms related to BP.  Has come down some in ED

## 2017-05-10 NOTE — ED Notes (Addendum)
Pt prepped for colonoscopy overnight - arrived for procedure BP elevated - sent here for BP evaluation  Pt denies any BP med currently prescribed   NAD assessed

## 2017-05-10 NOTE — OR Nursing (Signed)
Procedure cancelled due to elevated BP. Patient taken to ED via wheelchair with IV in per request of Dr. Tobi BastosAnna.

## 2017-05-10 NOTE — ED Provider Notes (Signed)
[facility] Emergency Department Provider Note  ____________________________________________   First MD Initiated Contact with Patient 05/10/17 1139     (approximate)  I have reviewed the triage vital signs and the nursing notes.   HISTORY  Chief Complaint Hypertension    HPI Alex Patterson P Kravitz is a 57 y.o. male with medical history as listed below which notably includes a history of prior elevated blood pressure measurements but without a diagnosis of hypertension and who takes no antihypertensive medications.  He presents today from the endoscopy suite for further evaluation of elevated blood pressure.  He states that he had a colonoscopy scheduled this morning but his blood pressure was greater than 200 systolic and they sent him to the ED for evaluation.  He denies any other symptoms.  He was told that it would be dangerous for him to have the procedure with his blood pressure the way it is so he will not be having the colonoscopy today.  He says that he had a follow-up appointment with his primary care doctor within the last 2 months and his blood pressure was in the 180s or 190s at that time.  This is relatively consistent for him.  He was on antihypertensives previously but whatever medicine he was on seem to drop his blood pressure too low, even to the point that he had a syncopal episode, so he came back off of the medication.  He denies headache, visual changes, sore throat, confusion, chest pain, shortness of breath, nausea, vomiting, abdominal pain, and dysuria.  His blood pressure is severe although it is always at least moderately elevated.  Unable to quantify whether the onset is acute or gradual as it is asymptomatic and he was unaware of it until it was checked at the endoscopy suite.    Past Medical History:  Diagnosis Date  . Disorder of eustachian tube   . Disorder of eustachian tube 02/17/2008  . Hypogonadism male   . Hypogonadism male  03/12/2009   04/25/10 Testosterone, Serum=214 ng/dL (Abn: L) [phone] Free Testosterone(Direct)= 3.9 pg/mL (abn: L) n=6.8-21.5    . Vitamin D deficiency     Patient Active Problem List   Diagnosis Date Noted  . ED (erectile dysfunction) 03/19/2017  . Vitamin D deficiency 07/29/2015  . Pure hypercholesterolemia 04/28/2008  . Family history of ischemic heart disease 04/26/2008  . Elevated blood-pressure reading without diagnosis of hypertension 04/26/2008  . Allergic rhinitis due to pollen 06/08/2005    Past Surgical History:  Procedure Laterality Date  . APPENDECTOMY  2001  . NASAL SINUS SURGERY  2003  . undescended left testicle  1973    Prior to Admission medications   Medication Sig Start Date End Date Taking? Authorizing Provider  amLODipine (NORVASC) 5 MG tablet Take 1 tablet (5 mg total) by mouth daily. 05/10/17 05/10/18  Loleta RoseForbach, Henli Hey, MD  cetirizine (ZYRTEC ALLERGY) 10 MG tablet Take 1 tablet by mouth daily. Reported on 07/29/2015    [provider]  fluticasone [facility](FLONASE ALLERGY RELIEF) 50 MCG/ACT nasal spray Place 2 sprays into the nose daily.    [provider]  sildenafil (VIAGRA) 50 MG tablet Take 1-2 tablets (50-100 mg total) by mouth daily as needed. 03/13/17   Malva LimesFisher, Donald E, MD    Allergies Patient has no known allergies.  Family History  Problem Relation Age of Onset  . Lung cancer Mother   . Stroke Father     Social History Social History   Tobacco Use  .  Smoking status: Never Smoker  . Smokeless tobacco: Never Used  Substance Use Topics  . Alcohol use: No    Frequency: Never  . Drug use: No    Review of Systems Constitutional: No fever/chills Eyes: No visual changes. ENT: No sore throat. Cardiovascular: Denies chest pain. Respiratory: Denies shortness of breath. Gastrointestinal: No abdominal pain.  No nausea, no vomiting.  No diarrhea.  No constipation. Genitourinary: Negative for dysuria. Musculoskeletal: Negative for neck  pain.  Negative for back pain. Integumentary: Negative for rash. Neurological: Negative for headaches, focal weakness or numbness. Psychiatric:No complaints or concerns as per patient and wife  ____________________________________________   PHYSICAL EXAM:  VITAL SIGNS: ED Triage Vitals  Enc Vitals Group     BP 05/10/17 1030 (!) 191/124     Pulse Rate 05/10/17 1030 70     Resp 05/10/17 1030 16     Temp 05/10/17 1030 98.7 F (37.1 C)     Temp Source 05/10/17 1030 Oral     SpO2 05/10/17 1030 98 %     Weight 05/10/17 1031 90.7 kg (200 lb)     Height 05/10/17 1031 1.702 m (5\' 7" )     Head Circumference --      Peak Flow --      Pain Score --      Pain Loc --      Pain Edu? --      Excl. in GC? --     Constitutional: Alert and oriented. Well appearing and in no acute distress. Eyes: Conjunctivae are normal. PERRL. EOMI. Head: Atraumatic. Nose: Mild congestion/rhinnorhea for several days Mouth/Throat: Mucous membranes are moist. Neck: No stridor.  No meningeal signs.   Cardiovascular: Normal rate, regular rhythm. Good peripheral circulation. Grossly normal heart sounds. Respiratory: Normal respiratory effort.  No retractions. Lungs CTAB. Gastrointestinal: Soft and nontender. No distention.  Musculoskeletal: No lower extremity tenderness nor edema. No gross deformities of extremities. Neurologic:  Normal speech and language. No gross focal neurologic deficits are appreciated.  Skin:  Skin is warm, dry and intact. No rash noted. Psychiatric: Mood and affect are normal. Speech and behavior are normal.  ____________________________________________   LABS (all labs ordered are listed, but only abnormal results are displayed)  Labs Reviewed  COMPREHENSIVE METABOLIC PANEL - Abnormal; Notable for the following components:      Result Value   Calcium 8.5 (*)    All other components within normal limits  CBC WITH DIFFERENTIAL/PLATELET  TROPONIN I    ____________________________________________  EKG  ED ECG REPORT I, Loleta Rose, the attending physician, personally viewed and interpreted this ECG.  Date: 05/10/2017 EKG Time: 12:14 Rate: 64 Rhythm: normal sinus rhythm QRS Axis: normal Intervals: Incomplete RBBB and LPFB ST/T Wave abnormalities: Non-specific ST segment / T-wave changes, but no evidence of acute ischemia. Narrative Interpretation: no evidence of acute ischemia  ____________________________________________  RADIOLOGY   ED MD interpretation: No imaging indicated  Official radiology report(s): No results found.  ____________________________________________   PROCEDURES  Critical Care performed: No   Procedure(s) performed:   Procedures   ____________________________________________   INITIAL IMPRESSION / ASSESSMENT AND PLAN / ED COURSE  As part of my medical decision making, I reviewed the following data within the electronic MEDICAL RECORD NUMBER Nursing notes reviewed and incorporated, Labs reviewed , EKG interpreted  and Old chart reviewed    Differential diagnosis includes, but is not limited to, asymptomatic essential hypertension, hypertensive urgency/emergency, renal artery dysfunction, ACS, etc.  The patient has absolutely no physical  symptoms, just a substantially elevated blood pressure.  He states that usually his systolic pressure is in the 180s-190s and that is the current range of his blood pressure in the ED even though it was higher earlier in the endoscopy suite.  His diastolic pressure is elevated consistently in the 110s.  I discussed it with the patient and we decided to try a dose of amlodipine by mouth and check blood work to make sure there is no evidence of any endorgan dysfunction.  As long as there is no evidence of hypertensive urgency/emergency, he should be appropriate for discharge and outpatient follow-up.  Given the degree of his hypertension, I plan to start him on  amlodipine unless there is a contraindication.  I explained that aggressive intervention on asymptomatic hypertension can have severe negative consequences, and he is comfortable with remaining hypertensive and trying gentle correction with oral medication.  No indication of any chest pain, no indication of any infectious process.  Clinical Course as of May 11 1330  Fri May 10, 2017  1324 Patient has remained asymptomatic in the ED.  His blood pressure is come down somewhat but is still significantly elevated particularly his diastolic measurement.  However, given that this is asymptomatic hypertension, he, his wife, and I are all comfortable with the plan for discharge and outpatient follow-up with his PCP.  He has no evidence of any end-organ dysfunction and therefore no evidence of hypertensive urgency/emergency.  I am starting him on amlodipine and he will follow-up as an outpatient.  I gave my usual and customary return precautions.   [CF]    Clinical Course User Index [CF] Loleta Rose, MD    ____________________________________________  FINAL CLINICAL IMPRESSION(S) / ED DIAGNOSES  Final diagnoses:  Essential hypertension     MEDICATIONS GIVEN DURING THIS VISIT:  Medications  amLODipine (NORVASC) tablet 5 mg (5 mg Oral Given 05/10/17 1216)     ED Discharge Orders        Ordered    amLODipine (NORVASC) 5 MG tablet  Daily     05/10/17 1330       Note:  This document was prepared using Dragon voice recognition software and may include unintentional dictation errors.    Loleta Rose, MD 05/10/17 1332

## 2017-05-10 NOTE — ED Notes (Signed)
Pt in NAD at this time. Pt given water along with medication. Pt tolerated well. Pt denying any further needs at this time.

## 2017-05-14 ENCOUNTER — Encounter: Payer: Self-pay | Admitting: Emergency Medicine

## 2017-05-14 ENCOUNTER — Emergency Department: Payer: Federal, State, Local not specified - PPO

## 2017-05-14 ENCOUNTER — Inpatient Hospital Stay
Admission: EM | Admit: 2017-05-14 | Discharge: 2017-05-16 | DRG: 282 | Disposition: A | Discharge status: Home / Self Care | Payer: Federal, State, Local not specified - PPO | Attending: Internal Medicine | Admitting: Internal Medicine

## 2017-05-14 ENCOUNTER — Ambulatory Visit: Payer: Federal, State, Local not specified - PPO | Admitting: Family Medicine

## 2017-05-14 ENCOUNTER — Other Ambulatory Visit: Payer: Self-pay

## 2017-05-14 ENCOUNTER — Encounter: Payer: Self-pay | Admitting: Family Medicine

## 2017-05-14 VITALS — BP 162/92 | HR 72 | Temp 98.8°F | Resp 16 | Ht 67.0 in | Wt 208.0 lb

## 2017-05-14 DIAGNOSIS — R079 Chest pain, unspecified: Secondary | ICD-10-CM | POA: Diagnosis not present

## 2017-05-14 DIAGNOSIS — I10 Essential (primary) hypertension: Secondary | ICD-10-CM

## 2017-05-14 DIAGNOSIS — I21A1 Myocardial infarction type 2: Principal | ICD-10-CM | POA: Diagnosis present

## 2017-05-14 DIAGNOSIS — R400 Somnolence: Secondary | ICD-10-CM

## 2017-05-14 DIAGNOSIS — Z6831 Body mass index (BMI) 31.0-31.9, adult: Secondary | ICD-10-CM | POA: Diagnosis not present

## 2017-05-14 DIAGNOSIS — Z801 Family history of malignant neoplasm of trachea, bronchus and lung: Secondary | ICD-10-CM

## 2017-05-14 DIAGNOSIS — I214 Non-ST elevation (NSTEMI) myocardial infarction: Secondary | ICD-10-CM

## 2017-05-14 DIAGNOSIS — I160 Hypertensive urgency: Secondary | ICD-10-CM | POA: Diagnosis not present

## 2017-05-14 DIAGNOSIS — Z823 Family history of stroke: Secondary | ICD-10-CM

## 2017-05-14 DIAGNOSIS — E559 Vitamin D deficiency, unspecified: Secondary | ICD-10-CM | POA: Diagnosis present

## 2017-05-14 DIAGNOSIS — Z1211 Encounter for screening for malignant neoplasm of colon: Secondary | ICD-10-CM

## 2017-05-14 DIAGNOSIS — Z7951 Long term (current) use of inhaled steroids: Secondary | ICD-10-CM

## 2017-05-14 DIAGNOSIS — E7800 Pure hypercholesterolemia, unspecified: Secondary | ICD-10-CM | POA: Diagnosis not present

## 2017-05-14 DIAGNOSIS — E669 Obesity, unspecified: Secondary | ICD-10-CM | POA: Diagnosis not present

## 2017-05-14 DIAGNOSIS — E785 Hyperlipidemia, unspecified: Secondary | ICD-10-CM | POA: Diagnosis not present

## 2017-05-14 DIAGNOSIS — J301 Allergic rhinitis due to pollen: Secondary | ICD-10-CM | POA: Diagnosis not present

## 2017-05-14 DIAGNOSIS — T888 Other specified complications of surgical and medical care, not elsewhere classified: Secondary | ICD-10-CM | POA: Diagnosis not present

## 2017-05-14 DIAGNOSIS — I2489 Other forms of acute ischemic heart disease: Secondary | ICD-10-CM | POA: Diagnosis present

## 2017-05-14 DIAGNOSIS — I248 Other forms of acute ischemic heart disease: Secondary | ICD-10-CM | POA: Diagnosis present

## 2017-05-14 DIAGNOSIS — E66811 Obesity, class 1: Secondary | ICD-10-CM | POA: Diagnosis present

## 2017-05-14 LAB — TROPONIN I: Troponin I: 0.23 ng/mL (ref <0.03)

## 2017-05-14 LAB — BASIC METABOLIC PANEL
ANION GAP: 8 (ref 5–15)
BUN: 17 mg/dL (ref 6–20)
CHLORIDE: 102 mmol/L (ref 101–111)
CO2: 28 mmol/L (ref 22–32)
Calcium: 9.3 mg/dL (ref 8.9–10.3)
Creatinine, Ser: 0.83 mg/dL (ref 0.61–1.24)
GFR calc Af Amer: >60 mL/min (ref >60)
GFR calc non Af Amer: >60 mL/min (ref >60)
GLUCOSE: 114 mg/dL — AB (ref 65–99)
Potassium: 3.5 mmol/L (ref 3.5–5.1)
Sodium: 138 mmol/L (ref 135–145)

## 2017-05-14 LAB — CBC
HEMATOCRIT: 44.2 % (ref 40.0–52.0)
HEMOGLOBIN: 15.1 g/dL (ref 13.0–18.0)
MCH: 27.8 pg (ref 26.0–34.0)
MCHC: 34.2 g/dL (ref 32.0–36.0)
MCV: 81.3 fL (ref 80.0–100.0)
Platelets: 225 10*3/uL (ref 150–440)
RBC: 5.44 MIL/uL (ref 4.40–5.90)
RDW: 12.9 % (ref 11.5–14.5)
WBC: 7.9 10*3/uL (ref 3.8–10.6)

## 2017-05-14 MED ORDER — NITROGLYCERIN 2 % TD OINT
1.0000 [in_us] | TOPICAL_OINTMENT | Freq: Once | TRANSDERMAL | Status: AC
  Administered 2017-05-14: 1 [in_us] via TOPICAL

## 2017-05-14 MED ORDER — ASPIRIN 81 MG PO CHEW
CHEWABLE_TABLET | ORAL | Status: AC
  Administered 2017-05-14: 324 mg via ORAL
  Filled 2017-05-14: qty 4

## 2017-05-14 MED ORDER — LISINOPRIL 5 MG PO TABS: 5.0000 mg | ORAL_TABLET | ORAL | 2 refills | Status: DC

## 2017-05-14 MED ORDER — NITROGLYCERIN 2 % TD OINT
TOPICAL_OINTMENT | TRANSDERMAL | Status: AC
  Filled 2017-05-14: qty 1

## 2017-05-14 MED ORDER — ASPIRIN 81 MG PO CHEW
324.0000 mg | CHEWABLE_TABLET | Freq: Once | ORAL | Status: AC
  Administered 2017-05-14: 324 mg via ORAL

## 2017-05-14 NOTE — ED Provider Notes (Addendum)
[facility] Emergency Department Provider Note    First MD Initiated Contact with Patient 05/14/17 2330     (approximate)  I have reviewed the triage vital signs and the nursing notes.   HISTORY  Chief Complaint Chest Pain    HPI Alex Patterson is a 57 y.o. male to the emergency department with chest pressure and elevated blood pressure.  Patient states that he had no previous history of hypertension however on March 1 when he presented for colonoscopy it was unable to be performed secondary to markedly elevated blood pressure.  Patient states today that he has had left-sided chest pressure that is nonradiating.  Patient blood pressure noted to be 205/126 on arrival.  Patient denies any dyspnea no diaphoresis.   Past Medical History:  Diagnosis Date  . Disorder of eustachian tube   . Disorder of eustachian tube 02/17/2008  . Hypertension   . Hypogonadism male   . Hypogonadism male 03/12/2009   04/25/10 Testosterone, Serum=214 ng/dL (Abn: L) [id number] Free Testosterone(Direct)= 3.9 pg/mL (abn: L) n=6.8-21.5    . Vitamin D deficiency     Patient Active Problem List   Diagnosis Date Noted  . ED (erectile dysfunction) 03/19/2017  . Vitamin D deficiency 07/29/2015  . Pure hypercholesterolemia 04/28/2008  . Family history of ischemic heart disease 04/26/2008  . Elevated blood-pressure reading without diagnosis of hypertension 04/26/2008  . Allergic rhinitis due to pollen 06/08/2005    Past Surgical History:  Procedure Laterality Date  . APPENDECTOMY  2001  . NASAL SINUS SURGERY  2003  . undescended left testicle  1973    Prior to Admission medications   Medication Sig Start Date End Date Taking? Authorizing Provider  amLODipine (NORVASC) 5 MG tablet Take 1 tablet (5 mg total) by mouth daily. 05/10/17 05/10/18  Loleta Rose, MD  cetirizine (ZYRTEC ALLERGY) 10 MG tablet Take 1 tablet by mouth daily. Reported on 07/29/2015    [provider]    fluticasone [facility] ALLERGY RELIEF) 50 MCG/ACT nasal spray Place 2 sprays into the nose daily.    [provider]  lisinopril (PRINIVIL,ZESTRIL) 5 MG tablet Take 1 tablet (5 mg total) by mouth every morning. 05/14/17   Malva Limes, MD  sildenafil (VIAGRA) 50 MG tablet Take 1-2 tablets (50-100 mg total) by mouth daily as needed. 03/13/17   Malva Limes, MD    Allergies No known drug allergies  Family History  Problem Relation Age of Onset  . Lung cancer Mother   . Stroke Father     Social History Social History   Tobacco Use  . Smoking status: Never Smoker  . Smokeless tobacco: Never Used  Substance Use Topics  . Alcohol use: No    Frequency: Never  . Drug use: No    Review of Systems Constitutional: No fever/chills Eyes: No visual changes. ENT: No sore throat. Cardiovascular: Denies chest pain. Respiratory: Denies shortness of breath. Gastrointestinal: No abdominal pain.  No nausea, no vomiting.  No diarrhea.  No constipation. Genitourinary: Negative for dysuria. Musculoskeletal: Negative for neck pain.  Negative for back pain. Integumentary: Negative for rash. Neurological: Negative for headaches, focal weakness or numbness.   ____________________________________________   PHYSICAL EXAM:  VITAL SIGNS: ED Triage Vitals  Enc Vitals Group     BP 05/14/17 2259 (!) 205/126     Pulse Rate 05/14/17 2259 84     Resp 05/14/17 2259 18     Temp 05/14/17 2259 98.1 F (36.7 C)  Temp Source 05/14/17 2259 Oral     SpO2 05/14/17 2259 100 %     Weight 05/14/17 2254 90.7 kg (200 lb)     Height 05/14/17 2254 1.702 m (5\' 7" )     Head Circumference --      Peak Flow --      Pain Score 05/14/17 2253 1     Pain Loc --      Pain Edu? --      Excl. in GC? --     Constitutional: Alert and oriented. Well appearing and in no acute distress. Eyes: Conjunctivae are normal.  Head: Atraumatic. Mouth/Throat: Mucous membranes are moist.  Oropharynx  non-erythematous. Neck: No stridor.   Cardiovascular: Normal rate, regular rhythm. Good peripheral circulation. Grossly normal heart sounds. Respiratory: Normal respiratory effort.  No retractions. Lungs CTAB. Gastrointestinal: Soft and nontender. No distention.  Musculoskeletal: No lower extremity tenderness nor edema. No gross deformities of extremities. Neurologic:  Normal speech and language. No gross focal neurologic deficits are appreciated.  Skin:  Skin is warm, dry and intact. No rash noted. Psychiatric: Mood and affect are normal. Speech and behavior are normal.  ____________________________________________   LABS (all labs ordered are listed, but only abnormal results are displayed)  Labs Reviewed  BASIC METABOLIC PANEL - Abnormal; Notable for the following components:      Result Value   Glucose, Bld 114 (*)    All other components within normal limits  TROPONIN I - Abnormal; Notable for the following components:   Troponin I 0.23 (*)    All other components within normal limits  CBC   ____________________________________________  EKG  ED ECG REPORT I, [place] N BROWN, the attending physician, personally viewed and interpreted this ECG.   Date: 05/14/2017  EKG Time: 10:51 PM  Rate: 71  Rhythm: Normal sinus rhythm  Axis: Normal  Intervals: Normal  ST&T Change: None  ____________________________________________  RADIOLOGY I, [place] N BROWN, personally viewed and evaluated these images (plain radiographs) as part of my medical decision making, as well as reviewing the written report by the radiologist.  ED MD interpretation: No acute findings on chest x-ray  Official radiology report(s): Dg Chest Port 1 View  Result Date: 05/14/2017 CLINICAL DATA:  Chest pain. EXAM: PORTABLE CHEST 1 VIEW COMPARISON:  None. FINDINGS: The cardiomediastinal contours are normal. The lungs are clear. Pulmonary vasculature is normal. No consolidation, pleural effusion, or  pneumothorax. No acute osseous abnormalities are seen. IMPRESSION: Unremarkable AP view of the chest. Electronically Signed   By: Rubye OaksMelanie  Ehinger M.D.   On: 05/14/2017 23:29    ____________________________________________   PROCEDURES  Critical Care performed: CRITICAL CARE n of patient's condition.  .Critical Care Performed by: Darci CurrentBrown, [place] N, MD Authorized by: Darci CurrentBrown, [place] N, MD   Critical care provider statement:    Critical care start time:  05/14/2017 11:30 PM   Critical care end time:  05/15/2017 12:00 AM   Critical care was necessary to treat or prevent imminent or life-threatening deterioration of the following conditions:  Circulatory failure   Critical care was time spent personally by me on the following activities:  Development of treatment plan with patient or surrogate, discussions with consultants, evaluation of patient's response to treatment, examination of patient, obtaining history from patient or surrogate, ordering and performing treatments and interventions, ordering and review of laboratory studies, ordering and review of radiographic studies, pulse oximetry, re-evaluation of patient's condition and review of old charts   I assumed direction of critical care  for this patient from another provider in my specialty: no        ____________________________________________   INITIAL IMPRESSION / ASSESSMENT AND PLAN / ED COURSE  As part of my medical decision making, I reviewed the following data within the electronic MEDICAL RECORD NUMBER35 year old male presented with above-stated history and physical exam secondary to hypertension and chest pain.  EKG revealed no evidence of STEMI.  Laboratory data notable for troponin 0 0.23.  Patient was seen on May 10, 2017 with a negative troponin noted at that time.  Concern for possible N STEMI versus demand ischemia.  Patient given aspirin during 24 mg and heparin infusion.  On reevaluation patient denies any chest pain at  present. ____________________________________________  FINAL CLINICAL IMPRESSION(S) / ED DIAGNOSES  Final diagnoses:  NSTEMI (non-ST elevated myocardial infarction) (HCC)     MEDICATIONS GIVEN DURING THIS VISIT:  Medications - No data to display   ED Discharge Orders    None       Note:  This document was prepared using Dragon voice recognition software and may include unintentional dictation errors.    Darci Current, MD 05/15/17 1610    Darci Current, MD 05/24/17 [phone]

## 2017-05-14 NOTE — Patient Instructions (Addendum)
   Avoid taking any oral decongestants such as Mucinex-D   Call if any side effects after starting lisinopril

## 2017-05-14 NOTE — Progress Notes (Signed)
Patient: Alex Patterson Male    DOB: 1961/01/21   57 y.o.   MRN: [id number] Visit Date: 05/14/2017  Today's Provider: Mila Merryonald Fisher, MD   Chief Complaint  Patient presents with  . Hospitalization Follow-up   Subjective:    HPI   Follow up ER visit  Patient was seen in ER for Hypertension on 05/10/2017. He was treated for elevated blood pressure. He was at the hospital initially to have his colonoscopy done, but they canceled the procedure due to elevated blood pressure Treatment for this included;  EKG and labs done, which were normal. Given amlodipine 5 mg qd in ED with 30 days prescription. . Patient's bp in ER was 191/124. He reports good compliance with treatment. Feels a little more tired than usual since starting medication.    Patient reports that he has been checking his BP at home and it is still averaging in the 160s/90s. He does have occasional headaches, but feels this may be sinus related. Denies chest pain or discomfort.   He states he was in his usual state of health prior to day of scheduled colonoscopy. He was not anxious at all about procedure. He has had some trouble with sinus pressure and uses OTC nasal steroids daily. He also reports taking Mucinex-D for a few days at time, the last about 10 days ago. Denies recent NSAID use. He has been working on regular exercise and reducing sodium intake since he had mild BP elevation of 144/88  When he was here for his physical in January.    Wt Readings from Last 5 Encounters:  05/14/17 208 lb (94.3 kg)  05/10/17 200 lb (90.7 kg)  05/10/17 200 lb (90.7 kg)  03/13/17 208 lb (94.3 kg)  07/29/15 208 lb (94.3 kg)   .    No Known Allergies   Current Outpatient Medications:  .  amLODipine (NORVASC) 5 MG tablet, Take 1 tablet (5 mg total) by mouth daily., Disp: 30 tablet, Rfl: 1 .  cetirizine (ZYRTEC ALLERGY) 10 MG tablet, Take 1 tablet by mouth daily. Reported on 07/29/2015, Disp: , Rfl:  .  fluticasone  (FLONASE ALLERGY RELIEF) 50 MCG/ACT nasal spray, Place 2 sprays into the nose daily., Disp: , Rfl:  .  sildenafil (VIAGRA) 50 MG tablet, Take 1-2 tablets (50-100 mg total) by mouth daily as needed., Disp: 8 tablet, Rfl: 2  Review of Systems  Constitutional: Negative for appetite change, chills and fever.  Respiratory: Negative for chest tightness, shortness of breath and wheezing.   Cardiovascular: Negative for chest pain and palpitations.  Gastrointestinal: Negative for abdominal pain, nausea and vomiting.    Social History   Tobacco Use  . Smoking status: Never Smoker  . Smokeless tobacco: Never Used  Substance Use Topics  . Alcohol use: No    Frequency: Never   Objective:   BP (!) 162/92 (BP Location: Left Arm, Patient Position: Sitting, Cuff Size: Large)   Pulse 72   Temp 98.8 F (37.1 C)   Resp 16   Ht 5\' 7"  (1.702 m)   Wt 208 lb (94.3 kg)   SpO2 98%   BMI 32.58 kg/m  Vitals:   05/14/17 1617  BP: (!) 162/92  Pulse: 72  Resp: 16  Temp: 98.8 F (37.1 C)  SpO2: 98%  Weight: 208 lb (94.3 kg)  Height: 5\' 7"  (1.702 m)     Physical Exam  General appearance: alert, well developed, well nourished, cooperative and in no distress  Head: Normocephalic, without obvious abnormality, atraumatic Respiratory: Respirations even and unlabored, normal respiratory rate Extremities: No gross deformities Skin: Skin color, texture, turgor normal. No rashes seen  Psych: Appropriate mood and affect. Neurologic: Mental status: Alert, oriented to person, place, and time, thought content appropriate.     Assessment & Plan:     1. Essential hypertension Tolerating initiation of amlodipine but still significantly elevated. He is to take amlodipine at night and add lisinopril 5mg  in the morning.  - Home sleep test  2. Sleepiness He had o/n oximetry in 2015 with AHI of 10.8. Recommended a formal sleep study at that time, but has not been done. He states his wife tells him he doesn't  snore, but when he goes on camping trips he is told he snores loudly.  - Home sleep test  3. Colon cancer screening He prefers not to go through prep for colonoscopy again.  - Cologuard  4. Allergic rhinitis He is using nasal steroid which is not completely controlling symptoms. Recommend regular nasal saline. Offered Astelin prescription which he has taken in the past, but he want to see how things go in the next couple of weeks.     Return in about 3 weeks (around 06/04/2017).       Mila Merry, MD  [facility]

## 2017-05-14 NOTE — ED Triage Notes (Signed)
Pt presents to ED with "mild chest pain" and elevated blood pressure. Not currently taking medications for HTN.  pt scheduled to have colonoscopy Friday and was told they would be unable due proceed due to his 213 systolic BP. PO meds given at that time and pt reports improvement until tonight. Onset of chest pain was this evening around 2130. Unsure if was something he ate.

## 2017-05-15 ENCOUNTER — Inpatient Hospital Stay
Admit: 2017-05-15 | Discharge: 2017-05-15 | Disposition: A | Discharge status: Home / Self Care | Payer: Federal, State, Local not specified - PPO | Attending: Internal Medicine | Admitting: Internal Medicine

## 2017-05-15 ENCOUNTER — Encounter: Payer: Self-pay | Admitting: Internal Medicine

## 2017-05-15 ENCOUNTER — Other Ambulatory Visit: Payer: Self-pay

## 2017-05-15 DIAGNOSIS — E559 Vitamin D deficiency, unspecified: Secondary | ICD-10-CM | POA: Diagnosis present

## 2017-05-15 DIAGNOSIS — R748 Abnormal levels of other serum enzymes: Secondary | ICD-10-CM | POA: Diagnosis not present

## 2017-05-15 DIAGNOSIS — I248 Other forms of acute ischemic heart disease: Secondary | ICD-10-CM

## 2017-05-15 DIAGNOSIS — I21A1 Myocardial infarction type 2: Secondary | ICD-10-CM | POA: Diagnosis present

## 2017-05-15 DIAGNOSIS — Z7951 Long term (current) use of inhaled steroids: Secondary | ICD-10-CM | POA: Diagnosis not present

## 2017-05-15 DIAGNOSIS — Z801 Family history of malignant neoplasm of trachea, bronchus and lung: Secondary | ICD-10-CM | POA: Diagnosis not present

## 2017-05-15 DIAGNOSIS — E669 Obesity, unspecified: Secondary | ICD-10-CM | POA: Diagnosis present

## 2017-05-15 DIAGNOSIS — R079 Chest pain, unspecified: Secondary | ICD-10-CM | POA: Diagnosis not present

## 2017-05-15 DIAGNOSIS — I214 Non-ST elevation (NSTEMI) myocardial infarction: Secondary | ICD-10-CM | POA: Diagnosis not present

## 2017-05-15 DIAGNOSIS — I249 Acute ischemic heart disease, unspecified: Secondary | ICD-10-CM | POA: Diagnosis not present

## 2017-05-15 DIAGNOSIS — I160 Hypertensive urgency: Secondary | ICD-10-CM | POA: Diagnosis present

## 2017-05-15 DIAGNOSIS — E785 Hyperlipidemia, unspecified: Secondary | ICD-10-CM | POA: Diagnosis present

## 2017-05-15 DIAGNOSIS — R7989 Other specified abnormal findings of blood chemistry: Secondary | ICD-10-CM | POA: Diagnosis not present

## 2017-05-15 DIAGNOSIS — Z823 Family history of stroke: Secondary | ICD-10-CM | POA: Diagnosis not present

## 2017-05-15 DIAGNOSIS — Z6831 Body mass index (BMI) 31.0-31.9, adult: Secondary | ICD-10-CM | POA: Diagnosis not present

## 2017-05-15 DIAGNOSIS — I10 Essential (primary) hypertension: Secondary | ICD-10-CM | POA: Diagnosis not present

## 2017-05-15 DIAGNOSIS — I2489 Other forms of acute ischemic heart disease: Secondary | ICD-10-CM

## 2017-05-15 DIAGNOSIS — E7800 Pure hypercholesterolemia, unspecified: Secondary | ICD-10-CM | POA: Diagnosis not present

## 2017-05-15 HISTORY — DX: Other forms of acute ischemic heart disease: I24.89

## 2017-05-15 HISTORY — DX: Other forms of acute ischemic heart disease: I24.8

## 2017-05-15 LAB — TROPONIN I
TROPONIN I: 0.29 ng/mL — AB (ref <0.03)
TROPONIN I: 0.29 ng/mL — AB (ref <0.03)
Troponin I: 0.24 ng/mL (ref <0.03)
Troponin I: 0.19 ng/mL (ref <0.03)

## 2017-05-15 LAB — CBC
HCT: 40.8 % (ref 40.0–52.0)
Hemoglobin: 14.2 g/dL (ref 13.0–18.0)
MCH: 27.8 pg (ref 26.0–34.0)
MCHC: 34.7 g/dL (ref 32.0–36.0)
MCV: 80.2 fL (ref 80.0–100.0)
PLATELETS: 195 10*3/uL (ref 150–440)
RBC: 5.09 MIL/uL (ref 4.40–5.90)
RDW: 13 % (ref 11.5–14.5)
WBC: 6.8 10*3/uL (ref 3.8–10.6)

## 2017-05-15 LAB — BASIC METABOLIC PANEL
Anion gap: 8 (ref 5–15)
BUN: 16 mg/dL (ref 6–20)
CALCIUM: 8.8 mg/dL — AB (ref 8.9–10.3)
CO2: 25 mmol/L (ref 22–32)
CREATININE: 0.75 mg/dL (ref 0.61–1.24)
Chloride: 107 mmol/L (ref 101–111)
GFR calc Af Amer: >60 mL/min (ref >60)
GLUCOSE: 115 mg/dL — AB (ref 65–99)
Potassium: 3.5 mmol/L (ref 3.5–5.1)
SODIUM: 140 mmol/L (ref 135–145)

## 2017-05-15 LAB — APTT: aPTT: 30 seconds (ref 24–36)

## 2017-05-15 LAB — HEMOGLOBIN A1C
HEMOGLOBIN A1C: 5.3 % (ref 4.8–5.6)
Mean Plasma Glucose: 105.41 mg/dL

## 2017-05-15 LAB — PROTIME-INR
INR: 1.01
Prothrombin Time: 13.2 seconds (ref 11.4–15.2)

## 2017-05-15 LAB — HEPARIN LEVEL (UNFRACTIONATED): HEPARIN UNFRACTIONATED: 0.5 [IU]/mL (ref 0.30–0.70)

## 2017-05-15 MED ORDER — AMLODIPINE BESYLATE 5 MG PO TABS
5.0000 mg | ORAL_TABLET | Freq: Every day | ORAL | Status: DC
  Administered 2017-05-15 – 2017-05-16 (×2): 5 mg via ORAL
  Filled 2017-05-15 (×2): qty 1

## 2017-05-15 MED ORDER — HEPARIN BOLUS VIA INFUSION
4000.0000 [IU] | Freq: Once | INTRAVENOUS | Status: AC
  Administered 2017-05-15: 4000 [IU] via INTRAVENOUS
  Filled 2017-05-15: qty 4000

## 2017-05-15 MED ORDER — SALINE SPRAY 0.65 % NA SOLN
1.0000 | NASAL | Status: DC | PRN
  Administered 2017-05-15: 1 via NASAL
  Filled 2017-05-15: qty 44

## 2017-05-15 MED ORDER — ACETAMINOPHEN 325 MG PO TABS
650.0000 mg | ORAL_TABLET | Freq: Four times a day (QID) | ORAL | Status: DC | PRN
  Administered 2017-05-15: 650 mg via ORAL
  Filled 2017-05-15: qty 2

## 2017-05-15 MED ORDER — NITROGLYCERIN 2 % TD OINT
1.0000 [in_us] | TOPICAL_OINTMENT | Freq: Four times a day (QID) | TRANSDERMAL | Status: DC | PRN
Start: 2017-05-15 — End: 2017-05-16

## 2017-05-15 MED ORDER — ATORVASTATIN CALCIUM 20 MG PO TABS
40.0000 mg | ORAL_TABLET | Freq: Every day | ORAL | Status: DC
  Administered 2017-05-15 (×2): 40 mg via ORAL
  Filled 2017-05-15 (×2): qty 2

## 2017-05-15 MED ORDER — HEPARIN (PORCINE) IN NACL 100-0.45 UNIT/ML-% IJ SOLN: 16.0000 [IU]/kg/h | Freq: Once | INTRAMUSCULAR | Status: DC

## 2017-05-15 MED ORDER — LISINOPRIL 5 MG PO TABS
5.0000 mg | ORAL_TABLET | ORAL | Status: DC
  Administered 2017-05-15 – 2017-05-16 (×2): 5 mg via ORAL
  Filled 2017-05-15 (×2): qty 1

## 2017-05-15 MED ORDER — ONDANSETRON HCL 4 MG/2ML IJ SOLN: 4.0000 mg | Freq: Four times a day (QID) | INTRAMUSCULAR | Status: DC | PRN

## 2017-05-15 MED ORDER — ISOSORBIDE MONONITRATE ER 30 MG PO TB24
30.0000 mg | ORAL_TABLET | Freq: Every day | ORAL | Status: DC
  Administered 2017-05-15 – 2017-05-16 (×2): 30 mg via ORAL
  Filled 2017-05-15 (×2): qty 1

## 2017-05-15 MED ORDER — OXYCODONE HCL 5 MG PO TABS: 5.0000 mg | ORAL_TABLET | ORAL | Status: DC | PRN

## 2017-05-15 MED ORDER — ACETAMINOPHEN 650 MG RE SUPP: 650.0000 mg | Freq: Four times a day (QID) | RECTAL | Status: DC | PRN

## 2017-05-15 MED ORDER — HEPARIN (PORCINE) IN NACL 100-0.45 UNIT/ML-% IJ SOLN
1200.0000 [IU]/h | INTRAMUSCULAR | Status: DC
  Administered 2017-05-15: 1200 [IU]/h via INTRAVENOUS
  Filled 2017-05-15: qty 250

## 2017-05-15 MED ORDER — ONDANSETRON HCL 4 MG PO TABS: 4.0000 mg | ORAL_TABLET | Freq: Four times a day (QID) | ORAL | Status: DC | PRN

## 2017-05-15 MED ORDER — LABETALOL HCL 5 MG/ML IV SOLN: 10.0000 mg | INTRAVENOUS | Status: DC | PRN

## 2017-05-15 NOTE — ED Notes (Signed)
Patient ambulatory to restroom with steady gait, no assistance needed. Hospital bed placed in patient's room. Patient repositioned and given clean linen. Wife at bedside with patient. No further needs expressed at this time.

## 2017-05-15 NOTE — Progress Notes (Signed)
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  Wife at bedside.  Time spent: 20 mins

## 2017-05-15 NOTE — Progress Notes (Signed)
ANTICOAGULATION CONSULT NOTE - Initial Consult  Pharmacy Consult for heparin drip Indication: chest pain/ACS/NSTEMI  No Known Allergies  Patient Measurements: Height: 5\' 7"  (170.2 cm) Weight: 200 lb (90.7 kg) IBW/kg (Calculated) : 66.1 Heparin Dosing Weight: 85 kg  Vital Signs: Temp: 98.1 F (36.7 C) (03/05 2259) Temp Source: Oral (03/05 2259) BP: 149/100 (03/06 0230) Pulse Rate: 80 (03/06 0230)  Labs: Recent Labs    05/14/17 2257 05/15/17 0144  HGB 15.1  --   HCT 44.2  --   PLT 225  --   APTT  --  30  LABPROT  --  13.2  INR  --  1.01  CREATININE 0.83  --   TROPONINI 0.23*  --     Estimated Creatinine Clearance: 109.2 mL/min (by C-G formula based on SCr of 0.83 mg/dL).   Medical History: Past Medical History:  Diagnosis Date  . Disorder of eustachian tube 02/17/2008  . Hypertension   . Hypogonadism male 03/12/2009   04/25/10 Testosterone, Serum=214 ng/dL (Abn: L) [phone]=[phone] Free Testosterone(Direct)= 3.9 pg/mL (abn: L) n=6.8-21.5    . Vitamin D deficiency     Medications:  No anticoagulation in home meds  Assessment: Trop 0.23  Goal of Therapy:  Heparin level 0.3-0.7 units/ml Monitor platelets by anticoagulation protocol: Yes   Plan:  4000 unit bolus and initial rate of 1300 units/hr. First heparin level 6 hours after start of infusion.  Esmeralda Blanford S 05/15/2017,2:55 AM

## 2017-05-15 NOTE — ED Notes (Addendum)
Pt's wife Steward DroneBrenda leaving at this time, [phone] - call for any updates

## 2017-05-15 NOTE — ED Notes (Signed)
Heparin arrived from pharmacy 

## 2017-05-15 NOTE — H&P (Signed)
[facility] - [place] at [facility]   PATIENT NAME: Jacqulynn Cadetnand Johns    MR#:  [id number]  DATE OF BIRTH:  27-Jul-1960  DATE OF ADMISSION:  05/14/2017  PRIMARY CARE PHYSICIAN: Malva LimesFisher, Donald E, MD   REQUESTING/REFERRING PHYSICIAN: Manson PasseyBrown, MD  CHIEF COMPLAINT:   Chief Complaint  Patient presents with  . Chest Pain    HISTORY OF PRESENT ILLNESS:  Jacqulynn Cadetnand Koeppen  is a 57 y.o. male who presents with an episode of chest pain.  Patient states that he was seen here recently for scheduled colonoscopy, though this was not performed due to significantly elevated blood pressure detected at that time.  Patient states he had had some prior measurements of elevated blood pressure, but nothing as high as it was then.  In the past he had blood pressure readings as high as the 140 systolic he reports, but when he was here for his colonoscopy several days ago his blood pressure was in the 190s-200 systolic.  He followed up with his outpatient physician and was placed on antihypertensives.  Tonight he had an episode of chest pain while at rest at home.  This was central chest pain, pressure-like in character, without significant radiation, but with some associated shortness of breath.  He came to the ED for evaluation and was found to have some EKG changes, specifically with some T wave flattening and inversions in his inferior and lateral leads.  His troponin was elevated at 0.23.  His blood pressure was also significantly elevated with systolic initially in the 200s and diastolic greater than 100.  He was started on heparin and hospitalist were called for admission.  PAST MEDICAL HISTORY:   Past Medical History:  Diagnosis Date  . Disorder of eustachian tube 02/17/2008  . Hypertension   . Hypogonadism male 03/12/2009   04/25/10 Testosterone, Serum=214 ng/dL (Abn: L) [phone] Free Testosterone(Direct)= 3.9 pg/mL (abn: L) n=6.8-21.5    . Vitamin D deficiency      PAST SURGICAL  HISTORY:   Past Surgical History:  Procedure Laterality Date  . APPENDECTOMY  2001  . NASAL SINUS SURGERY  2003  . undescended left testicle  1973     SOCIAL HISTORY:   Social History   Tobacco Use  . Smoking status: Never Smoker  . Smokeless tobacco: Never Used  Substance Use Topics  . Alcohol use: No    Frequency: Never     FAMILY HISTORY:   Family History  Problem Relation Age of Onset  . Lung cancer Mother   . Stroke Father      DRUG ALLERGIES:  No Known Allergies  MEDICATIONS AT HOME:   Prior to Admission medications   Medication Sig Start Date End Date Taking? Authorizing Provider  amLODipine (NORVASC) 5 MG tablet Take 1 tablet (5 mg total) by mouth daily. 05/10/17 05/10/18 Yes Loleta RoseForbach, Cory, MD  fluticasone [facility](FLONASE ALLERGY RELIEF) 50 MCG/ACT nasal spray Place 2 sprays into the nose daily.   Yes [provider]  lisinopril (PRINIVIL,ZESTRIL) 5 MG tablet Take 1 tablet (5 mg total) by mouth every morning. 05/14/17  Yes Malva LimesFisher, Donald E, MD  sildenafil (VIAGRA) 50 MG tablet Take 1-2 tablets (50-100 mg total) by mouth daily as needed. 03/13/17  Yes Malva LimesFisher, Donald E, MD  cetirizine (ZYRTEC ALLERGY) 10 MG tablet Take 1 tablet by mouth daily. Reported on 07/29/2015    [provider]    REVIEW OF SYSTEMS:  Review of Systems  Constitutional: Negative for chills, fever, malaise/fatigue and  weight loss.  HENT: Negative for ear pain, hearing loss and tinnitus.   Eyes: Negative for blurred vision, double vision, pain and redness.  Respiratory: Positive for shortness of breath. Negative for cough and hemoptysis.   Cardiovascular: Positive for chest pain. Negative for palpitations, orthopnea and leg swelling.  Gastrointestinal: Negative for abdominal pain, constipation, diarrhea, nausea and vomiting.  Genitourinary: Negative for dysuria, frequency and hematuria.  Musculoskeletal: Negative for back pain, joint pain and neck pain.  Skin:       No acne, rash,  or lesions  Neurological: Negative for dizziness, tremors, focal weakness and weakness.  Endo/Heme/Allergies: Negative for polydipsia. Does not bruise/bleed easily.  Psychiatric/Behavioral: Negative for depression. The patient is not nervous/anxious and does not have insomnia.      VITAL SIGNS:   Vitals:   05/15/17 0100 05/15/17 0110 05/15/17 0120 05/15/17 0130  BP: (!) 164/104 (!) 154/105 (!) 162/99 (!) 157/101  Pulse: 73 74 71 79  Resp: 16 18 16 13   Temp:      TempSrc:      SpO2: 97% 96% 97% 98%  Weight:      Height:       Wt Readings from Last 3 Encounters:  05/14/17 90.7 kg (200 lb)  05/14/17 94.3 kg (208 lb)  05/10/17 90.7 kg (200 lb)    PHYSICAL EXAMINATION:  Physical Exam  Vitals reviewed. Constitutional: He is oriented to person, place, and time. He appears well-developed and well-nourished. No distress.  HENT:  Head: Normocephalic and atraumatic.  Mouth/Throat: Oropharynx is clear and moist.  Eyes: Conjunctivae and EOM are normal. Pupils are equal, round, and reactive to light. No scleral icterus.  Neck: Normal range of motion. Neck supple. No JVD present. No thyromegaly present.  Cardiovascular: Normal rate, regular rhythm and intact distal pulses. Exam reveals no gallop and no friction rub.  No murmur heard. Respiratory: Effort normal and breath sounds normal. No respiratory distress. He has no wheezes. He has no rales.  GI: Soft. Bowel sounds are normal. He exhibits no distension. There is no tenderness.  Musculoskeletal: Normal range of motion. He exhibits no edema.  No arthritis, no gout  Lymphadenopathy:    He has no cervical adenopathy.  Neurological: He is alert and oriented to person, place, and time. No cranial nerve deficit.  No dysarthria, no aphasia  Skin: Skin is warm and dry. No rash noted. No erythema.  Psychiatric: He has a normal mood and affect. His behavior is normal. Judgment and thought content normal.    LABORATORY PANEL:   CBC Recent  Labs  Lab 05/14/17 2257  WBC 7.9  HGB 15.1  HCT 44.2  PLT 225   ------------------------------------------------------------------------------------------------------------------  Chemistries  Recent Labs  Lab 05/10/17 1202 05/14/17 2257  NA 141 138  K 4.0 3.5  CL 108 102  CO2 25 28  GLUCOSE 94 114*  BUN 10 17  CREATININE 0.99 0.83  CALCIUM 8.5* 9.3  AST 24  --   ALT 22  --   ALKPHOS 69  --   BILITOT 0.9  --    ------------------------------------------------------------------------------------------------------------------  Cardiac Enzymes Recent Labs  Lab 05/14/17 2257  TROPONINI 0.23*   ------------------------------------------------------------------------------------------------------------------  RADIOLOGY:  Dg Chest Port 1 View  Result Date: 05/14/2017 CLINICAL DATA:  Chest pain. EXAM: PORTABLE CHEST 1 VIEW COMPARISON:  None. FINDINGS: The cardiomediastinal contours are normal. The lungs are clear. Pulmonary vasculature is normal. No consolidation, pleural effusion, or pneumothorax. No acute osseous abnormalities are seen. IMPRESSION: Unremarkable AP view  of the chest. Electronically Signed   By: Rubye Oaks M.D.   On: 05/14/2017 23:29    EKG:   Orders placed or performed during the hospital encounter of 05/14/17  . EKG 12-Lead  . EKG 12-Lead  . ED EKG within 10 minutes  . ED EKG within 10 minutes  . EKG 12-Lead  . EKG 12-Lead    IMPRESSION AND PLAN:  Principal Problem:   NSTEMI (non-ST elevated myocardial infarction) (HCC) -his chest pain symptoms are very typical for ACS, and with troponin elevated at 0.23 and EKG changes heparin drip was started.  Trend cardiac enzymes, echocardiogram and cardiology consult ordered.  We will work to reduce his blood pressure as below, and use Nitropaste as needed for chest pain symptoms Active Problems:   Accelerated hypertension -continue home meds for anti-hypertension, although patient will likely need  increased doses of these medications.  We will use additional PRN antihypertensives for now with a blood pressure goal less than 160/100   Pure hypercholesterolemia -patient recently had lipid values checked and they were elevated, we will start Lipitor   Obesity (BMI 30.0-34.9) -given patient's BMI is at risk for potential diabetes.  He did have a slightly elevated glucose level on evaluation in the ED tonight.  We will check a hemoglobin A1c with morning labs to screen for diabetes.  All the records are reviewed and case discussed with ED provider. Management plans discussed with the patient and/or family.  DVT PROPHYLAXIS: Systemic anticoagulation  GI PROPHYLAXIS: None  ADMISSION STATUS: Inpatient  CODE STATUS: Full Code Status History    This patient does not have a recorded code status. Please follow your organizational policy for patients in this situation.    Advance Directive Documentation     Most Recent Value  Type of Advance Directive  Healthcare Power of Attorney, Living will  Pre-existing out of facility DNR order (yellow form or pink MOST form)  No data  "MOST" Form in Place?  No data      TOTAL TIME TAKING CARE OF THIS PATIENT: 45 minutes.   Lynn Sissel FIELDING 05/15/2017, 1:47 AM  [employer]  [phone]  CC: Primary care physician; Malva Limes, MD  Note:  This document was prepared using Dragon voice recognition software and may include unintentional dictation errors.

## 2017-05-16 ENCOUNTER — Inpatient Hospital Stay: Payer: Federal, State, Local not specified - PPO

## 2017-05-16 LAB — NM MYOCAR MULTI W/SPECT W/WALL MOTION / EF
CHL CUP RESTING HR STRESS: 86 {beats}/min
CSEPED: 1 min
CSEPEDS: 1 s
CSEPEW: 1 METS
CSEPHR: 71 %
LV dias vol: 75 mL (ref 62–150)
LV sys vol: 21 mL
MPHR: 166 {beats}/min
NUC STRESS TID: 1.08
Peak HR: 118 {beats}/min
SDS: 2
SRS: 0
SSS: 2

## 2017-05-16 LAB — HIV ANTIBODY (ROUTINE TESTING W REFLEX): HIV Screen 4th Generation wRfx: NONREACTIVE

## 2017-05-16 LAB — ECHOCARDIOGRAM COMPLETE
HEIGHTINCHES: 67 in
Weight: 3243.2 oz

## 2017-05-16 MED ORDER — TECHNETIUM TC 99M TETROFOSMIN IV KIT
14.0800 | PACK | Freq: Once | INTRAVENOUS | Status: AC | PRN
  Administered 2017-05-16: 14.08 via INTRAVENOUS

## 2017-05-16 MED ORDER — TECHNETIUM TC 99M TETROFOSMIN IV KIT
30.0000 | PACK | Freq: Once | INTRAVENOUS | Status: AC | PRN
  Administered 2017-05-16: 30.06 via INTRAVENOUS

## 2017-05-16 MED ORDER — REGADENOSON 0.4 MG/5ML IV SOLN
0.4000 mg | Freq: Once | INTRAVENOUS | Status: AC
  Administered 2017-05-16: 0.4 mg via INTRAVENOUS
  Filled 2017-05-16: qty 5

## 2017-05-16 NOTE — Care Management (Signed)
No discharge needs identified by members of the care team 

## 2017-05-16 NOTE — Discharge Instructions (Signed)

## 2017-05-16 NOTE — Consult Note (Signed)
Reason for Consult: Chest pain hypertensive urgency Referring Physician: Juanetta Beets primary Dr. Lance Coon hospitalist  Alex Patterson is an 57 y.o. male.  HPI: Patient 57 year old male presents with chest pain symptoms known hypertension recently was scheduled for colonoscopy but it was canceled because of elevated blood pressure.  Patient was seen by his primary patient was placed on low-dose blood pressure medications but still had persistent trouble.  Patient had systolics of 809.  Patient developed some vague chest pain symptoms got concerned and had mild shortness of breath she came to the emergency room had slightly abnormal EKG findings and borderline troponins and was admitted for further assessment and care denies any previous cardiac history.  Does not smoke denies diabetes no significant family history  Past Medical History:  Diagnosis Date  . Disorder of eustachian tube 02/17/2008  . Hypertension   . Hypogonadism male 03/12/2009   04/25/10 Testosterone, Serum=214 ng/dL (Abn: L) [phone] Free Testosterone(Direct)= 3.9 pg/mL (abn: L) n=6.8-21.5    . Vitamin D deficiency     Past Surgical History:  Procedure Laterality Date  . APPENDECTOMY  2001  . NASAL SINUS SURGERY  2003  . undescended left testicle  1973    Family History  Problem Relation Age of Onset  . Lung cancer Mother   . Stroke Father     Social History:  reports that  has never smoked. he has never used smokeless tobacco. He reports that he does not drink alcohol or use drugs.  Allergies: No Known Allergies  Medications: I have reviewed the patient's current medications.  Results for orders placed or performed during the hospital encounter of 05/14/17 (from the past 48 hour(s))  Basic metabolic panel     Status: Abnormal   Collection Time: 05/14/17 10:57 PM  Result Value Ref Range   Sodium 138 135 - 145 mmol/L   Potassium 3.5 3.5 - 5.1 mmol/L   Chloride 102 101 - 111 mmol/L   CO2 28 22 - 32 mmol/L    Glucose, Bld 114 (H) 65 - 99 mg/dL   BUN 17 6 - 20 mg/dL   Creatinine, Ser 0.83 0.61 - 1.24 mg/dL   Calcium 9.3 8.9 - 10.3 mg/dL   GFR calc non Af Amer >60 >60 mL/min   GFR calc Af Amer >60 >60 mL/min    Comment: (NOTE) The eGFR has been calculated using the CKD EPI equation. This calculation has not been validated in all clinical situations. eGFR's persistently <60 mL/min signify possible Chronic Kidney Disease.    Anion gap 8 5 - 15    Comment: Performed at [facility], [address] [place], [place] [address]  CBC     Status: None   Collection Time: 05/14/17 10:57 PM  Result Value Ref Range   WBC 7.9 3.8 - 10.6 K/uL   RBC 5.44 4.40 - 5.90 MIL/uL   Hemoglobin 15.1 13.0 - 18.0 g/dL   HCT 44.2 40.0 - 52.0 %   MCV 81.3 80.0 - 100.0 fL   MCH 27.8 26.0 - 34.0 pg   MCHC 34.2 32.0 - 36.0 g/dL   RDW 12.9 11.5 - 14.5 %   Platelets 225 150 - 440 K/uL    Comment: Performed at [facility], [address] [place], [place] [address]  Troponin I     Status: Abnormal   Collection Time: 05/14/17 10:57 PM  Result Value Ref Range   Troponin I 0.23 (HH) <0.03 ng/mL    Comment: CRITICAL RESULT  CALLED TO, READ BACK BY AND VERIFIED WITH CASEY ROBERTS RN AT 2325 05/14/17. MSS Performed at [facility], [address] [place], [place] [address]   APTT     Status: None   Collection Time: 05/15/17  1:44 AM  Result Value Ref Range   aPTT 30 24 - 36 seconds    Comment: Performed at [facility], [address] [place], [place] [address]  Protime-INR     Status: None   Collection Time: 05/15/17  1:44 AM  Result Value Ref Range   Prothrombin Time 13.2 11.4 - 15.2 seconds   INR 1.01     Comment: Performed at [facility], [address] [place], [place] [address]  Troponin I     Status: Abnormal   Collection Time: 05/15/17  1:44 AM  Result Value Ref Range   Troponin I 0.29 (HH) <0.03 ng/mL    Comment: CRITICAL VALUE NOTED.  VALUE IS  CONSISTENT WITH PREVIOUSLY REPORTED AND CALLED VALUE. Performed at [facility], [address] [place], [place] [address]   HIV antibody (Routine Testing)     Status: None   Collection Time: 05/15/17  5:26 AM  Result Value Ref Range   HIV Screen 4th Generation wRfx Non Reactive Non Reactive    Comment: (NOTE) Performed At: [facility] [place], [place] [id number] Rush Farmer MD [id number] Performed at [facility], [address] [place], [place] [address]   Troponin I     Status: Abnormal   Collection Time: 05/15/17  5:26 AM  Result Value Ref Range   Troponin I 0.29 (HH) <0.03 ng/mL    Comment: CRITICAL VALUE NOTED. VALUE IS CONSISTENT WITH PREVIOUSLY REPORTED/CALLED VALUE  SDR Performed at [facility], [address] [place], [place] [address]   Basic metabolic panel     Status: Abnormal   Collection Time: 05/15/17  5:26 AM  Result Value Ref Range   Sodium 140 135 - 145 mmol/L   Potassium 3.5 3.5 - 5.1 mmol/L   Chloride 107 101 - 111 mmol/L   CO2 25 22 - 32 mmol/L   Glucose, Bld 115 (H) 65 - 99 mg/dL   BUN 16 6 - 20 mg/dL   Creatinine, Ser 0.75 0.61 - 1.24 mg/dL   Calcium 8.8 (L) 8.9 - 10.3 mg/dL   GFR calc non Af Amer >60 >60 mL/min   GFR calc Af Amer >60 >60 mL/min    Comment: (NOTE) The eGFR has been calculated using the CKD EPI equation. This calculation has not been validated in all clinical situations. eGFR's persistently <60 mL/min signify possible Chronic Kidney Disease.    Anion gap 8 5 - 15    Comment: Performed at [facility], [address] [place], [place] [address]  CBC     Status: None   Collection Time: 05/15/17  5:26 AM  Result Value Ref Range   WBC 6.8 3.8 - 10.6 K/uL   RBC 5.09 4.40 - 5.90 MIL/uL   Hemoglobin 14.2 13.0 - 18.0 g/dL   HCT 40.8 40.0 - 52.0 %   MCV 80.2 80.0 - 100.0 fL   MCH 27.8 26.0 - 34.0 pg   MCHC 34.7 32.0 - 36.0 g/dL   RDW 13.0 11.5 - 14.5 %    Platelets 195 150 - 440 K/uL    Comment: Performed at [facility], [address] [place],  [address]  Hemoglobin A1c     Status: None  Collection Time: 05/15/17  5:26 AM  Result Value Ref Range   Hgb A1c MFr Bld 5.3 4.8 - 5.6 %    Comment: (NOTE) Pre diabetes:          5.7%-6.4% Diabetes:              >6.4% Glycemic control for   <7.0% adults with diabetes    Mean Plasma Glucose 105.41 mg/dL    Comment: Performed at [facility] [address], [place], [place] [address]  Heparin level (unfractionated)     Status: None   Collection Time: 05/15/17  2:32 PM  Result Value Ref Range   Heparin Unfractionated 0.50 0.30 - 0.70 IU/mL    Comment:        IF HEPARIN RESULTS ARE BELOW EXPECTED VALUES, AND PATIENT DOSAGE HAS BEEN CONFIRMED, SUGGEST FOLLOW UP TESTING OF ANTITHROMBIN III LEVELS. Performed at [facility], [address] [place], [place] [address]   Troponin I     Status: Abnormal   Collection Time: 05/15/17  2:32 PM  Result Value Ref Range   Troponin I 0.24 (HH) <0.03 ng/mL    Comment: CRITICAL VALUE NOTED. VALUE IS CONSISTENT WITH PREVIOUSLY REPORTED/CALLED VALUE JJB Performed at [facility], [address] [place], [place] [address]   Troponin I     Status: Abnormal   Collection Time: 05/15/17  6:37 PM  Result Value Ref Range   Troponin I 0.19 (HH) <0.03 ng/mL    Comment: CRITICAL VALUE NOTED. VALUE IS CONSISTENT WITH PREVIOUSLY REPORTED/CALLED VALUE JJB Performed at [facility], [address] [place], [place] [address]     Dg Chest Port 1 View  Result Date: 05/14/2017 CLINICAL DATA:  Chest pain. EXAM: PORTABLE CHEST 1 VIEW COMPARISON:  None. FINDINGS: The cardiomediastinal contours are normal. The lungs are clear. Pulmonary vasculature is normal. No consolidation, pleural effusion, or pneumothorax. No acute osseous abnormalities are seen. IMPRESSION: Unremarkable AP view of the chest. Electronically Signed    By: Jeb Levering M.D.   On: 05/14/2017 23:29    Review of Systems  Constitutional: Positive for malaise/fatigue.  HENT: Positive for congestion.   Eyes: Negative.   Respiratory: Negative.   Cardiovascular: Positive for chest pain.  Gastrointestinal: Positive for heartburn.  Genitourinary: Negative.   Musculoskeletal: Negative.   Skin: Negative.   Neurological: Positive for weakness.  Endo/Heme/Allergies: Negative.   Psychiatric/Behavioral: Negative.    Blood pressure 114/71, pulse 84, temperature 98.7 F (37.1 C), temperature source Oral, resp. rate 17, height 5' 7"  (1.702 m), weight 202 lb 11.2 oz (91.9 kg), SpO2 99 %. Physical Exam  Nursing note and vitals reviewed. Constitutional: He is oriented to person, place, and time. He appears well-developed and well-nourished.  HENT:  Head: Normocephalic and atraumatic.  Eyes: Conjunctivae and EOM are normal. Pupils are equal, round, and reactive to light.  Neck: Normal range of motion. Neck supple.  Cardiovascular: Normal rate, regular rhythm and normal heart sounds.  Respiratory: Effort normal and breath sounds normal.  GI: Soft. Bowel sounds are normal.  Musculoskeletal: Normal range of motion.  Neurological: He is alert and oriented to person, place, and time. He has normal reflexes.  Skin: Skin is warm and dry.  Psychiatric: He has a normal mood and affect.    Assessment/Plan: Elevated troponin Hypertensive urgency Chest pain Hyperlipidemia Borderline obesity . Plan Agree with  rule out for myocardial infarction on telemetry Follow up cardiac enzymes and troponins Monitor EKG Echocardiogram will be helpful  for assessment left ventricular function Controlled hypertension advance medications Agree with functional study like stress test prior to discharge Maintain lipid management with Lipitor Encourage diet and exercise Consider follow-up with cardiology as an outpatient  Zameer Borman D Shiloh 05/16/2017, 7:35 AM

## 2017-05-16 NOTE — Plan of Care (Signed)
Complained of a headache once, tylenol given with relief, no chest pain. Up independently. BM last night 05/15/17

## 2017-05-17 ENCOUNTER — Telehealth: Payer: Self-pay | Admitting: Family Medicine

## 2017-05-17 ENCOUNTER — Telehealth: Payer: Self-pay

## 2017-05-17 DIAGNOSIS — I10 Essential (primary) hypertension: Secondary | ICD-10-CM

## 2017-05-17 MED ORDER — NITROGLYCERIN 0.4 MG SL SUBL: 0.4000 mg | SUBLINGUAL_TABLET | SUBLINGUAL | 2 refills | Status: DC | PRN

## 2017-05-17 NOTE — Telephone Encounter (Signed)
Contacted patient. He is back to work. Feels well, but BP was 180s/100s this morning on wrist monitor. No chest pains or shortness of breath. He took a second amlodipine today and will continue to take 2 x 5mg  very day in addition to lisinopril. 2313m daily. Also recommended 81mc ECASA daily. Sent in prescription for nitroglycerine to take if he develops chest pain again. He is to stop by office next week for BP check and follow up labs. Call if any new symptoms develop. He is aware of follow up appointment with Dr. Juliann Paresallwood and [facility].

## 2017-05-17 NOTE — Telephone Encounter (Signed)
TOC #1. Called pt to f/u after [facility]. Also wanted to confirm their hosp f/u appt w/ Dr. Sherrie MustacheFisher on 06/04/17 @ 4:20pm. Discharge planning includes the following:  - Stop Zyrtec - Continue Norvasc, Lisinopril - Low sodium diet - Continue to work on weight loss  - F/u with Dr. Nydia Boutonalwood on 05/30/17 @ 10:45am - F/u with Dr. Sherrie MustacheFisher on 06/04/17 @ 4:20pm [facility], I am also wanting to discuss/review the above information with the pt to ensure all of the above have been taken care of. LVM requesting returned call.

## 2017-05-18 NOTE — Discharge Summary (Signed)
[employer] - [place] at [facility]   PATIENT NAME: Alex Patterson    MR#:  [id number]  DATE OF BIRTH:  1961/02/28  DATE OF ADMISSION:  05/14/2017   ADMITTING PHYSICIAN: Oralia Manis, MD  DATE OF DISCHARGE: 05/16/2017  5:59 PM  PRIMARY CARE PHYSICIAN: Malva Limes, MD   ADMISSION DIAGNOSIS:  NSTEMI (non-ST elevated myocardial infarction) (HCC) [I21.4] Chest pain [R07.9] DISCHARGE DIAGNOSIS:  Principal Problem:   NSTEMI (non-ST elevated myocardial infarction) (HCC) Active Problems:   Accelerated hypertension   Pure hypercholesterolemia   Obesity (BMI 30.0-34.9)  SECONDARY DIAGNOSIS:   Past Medical History:  Diagnosis Date  . Disorder of eustachian tube 02/17/2008  . Hypertension   . Hypogonadism male 03/12/2009   04/25/10 Testosterone, Serum=214 ng/dL (Abn: L) W=[id number] Free Testosterone(Direct)= 3.9 pg/mL (abn: L) n=6.8-21.5    . Vitamin D deficiency    HOSPITAL COURSE:  71 y m admitted for chest pain and elevated BP  * Elevated troponins - Likely demand ischemia from uncontrolled HTN - His troponin peaked at 0.29 but trended down after that. - underwent echo & myoview - Echo showed normal Left ventriclecvcavity size. Wall thickness was increased in a pattern of mild LVH. There was mild concentric hypertrophy. Systolic function was vigorous. The estimated ejection fraction was in the range of 65% to 70%. Wall motion was normal; there were no regional wall motion abnormalities. Doppler parameters are consistent with abnormal left ventricular relaxation (grade 1 diastolic dysfunction). - Myoview demonstrated a normal BP response to exercise. There was no ST segment deviation noted during stress.There is a small defect of mild severity present in the apex location consistent with ischemia but Dr Juliann Pares feels this is artifact and no further work up needed at this time. This is a low risk study.  *Accelerated hypertension - could be from stress. Now  resolved -continue norvasc and lisinopril - his most BP readings in the Hospital were in good range so didn't start any new BP meds (initially considering Imdur) and I've requested him to double up doses of his current meds if BP runs high at home and reach out to his PCP & cardio for further adjustment in his meds as an outpt  *Pure hypercholesterolemia - continue Lipitor  *Obesity (BMI 30.0-34.9) - counseled DISCHARGE CONDITIONS:  stable CONSULTS OBTAINED:  Treatment Team:  Alwyn Pea, MD DRUG ALLERGIES:  No Known Allergies DISCHARGE MEDICATIONS:   Allergies as of 05/16/2017   No Known Allergies     Medication List    STOP taking these medications   ZYRTEC ALLERGY 10 MG tablet Generic drug:  cetirizine     TAKE these medications   amLODipine 5 MG tablet Commonly known as:  NORVASC Take 1 tablet (5 mg total) by mouth daily.   FLONASE ALLERGY RELIEF 50 MCG/ACT nasal spray Generic drug:  fluticasone Place 2 sprays into the nose daily.   lisinopril 5 MG tablet Commonly known as:  PRINIVIL,ZESTRIL Take 1 tablet (5 mg total) by mouth every morning.   sildenafil 50 MG tablet Commonly known as:  VIAGRA Take 1-2 tablets (50-100 mg total) by mouth daily as needed.        DISCHARGE INSTRUCTIONS:   DIET:  Regular diet DISCHARGE CONDITION:  Good ACTIVITY:  Activity as tolerated OXYGEN:  Home Oxygen: No.  Oxygen Delivery: room air DISCHARGE LOCATION:  home   If you experience worsening of your admission symptoms, develop shortness of breath, life threatening emergency, suicidal or homicidal thoughts you  must seek medical attention immediately by calling 911 or calling your MD immediately  if symptoms less severe.  You Must read complete instructions/literature along with all the possible adverse reactions/side effects for all the Medicines you take and that have been prescribed to you. Take any new Medicines after you have completely understood and  accpet all the possible adverse reactions/side effects.   Please note  You were cared for by a hospitalist during your hospital stay. If you have any questions about your discharge medications or the care you received while you were in the hospital after you are discharged, you can call the unit and asked to speak with the hospitalist on call if the hospitalist that took care of you is not available. Once you are discharged, your primary care physician will handle any further medical issues. Please note that NO REFILLS for any discharge medications will be authorized once you are discharged, as it is imperative that you return to your primary care physician (or establish a relationship with a primary care physician if you do not have one) for your aftercare needs so that they can reassess your need for medications and monitor your lab values.    On the day of Discharge:  VITAL SIGNS:  Blood pressure 124/73, pulse 96, temperature 98.8 F (37.1 C), temperature source Oral, resp. rate 18, height 5\' 7"  (1.702 m), weight 91.9 kg (202 lb 11.2 oz), SpO2 98 %. PHYSICAL EXAMINATION:  GENERAL:  57 y.o.-year-old patient lying in the bed with no acute distress.  EYES: Pupils equal, round, reactive to light and accommodation. No scleral icterus. Extraocular muscles intact.  HEENT: Head atraumatic, normocephalic. Oropharynx and nasopharynx clear.  NECK:  Supple, no jugular venous distention. No thyroid enlargement, no tenderness.  LUNGS: Normal breath sounds bilaterally, no wheezing, rales,rhonchi or crepitation. No use of accessory muscles of respiration.  CARDIOVASCULAR: S1, S2 normal. No murmurs, rubs, or gallops.  ABDOMEN: Soft, non-tender, non-distended. Bowel sounds present. No organomegaly or mass.  EXTREMITIES: No pedal edema, cyanosis, or clubbing.  NEUROLOGIC: Cranial nerves II through XII are intact. Muscle strength 5/5 in all extremities. Sensation intact. Gait not checked.  PSYCHIATRIC: The  patient is alert and oriented x 3.  SKIN: No obvious rash, lesion, or ulcer.  DATA REVIEW:   CBC Recent Labs  Lab 05/15/17 0526  WBC 6.8  HGB 14.2  HCT 40.8  PLT 195    Chemistries  Recent Labs  Lab 05/15/17 0526  NA 140  K 3.5  CL 107  CO2 25  GLUCOSE 115*  BUN 16  CREATININE 0.75  CALCIUM 8.8*    Follow-up Information    Malva LimesFisher, Donald E, MD. Go on 06/04/2017.   Specialty:  Family Medicine Why:  Follow up as scheduled previously on 06/04/2017 at 4:20pm. Thanks! Contact information: [address] [place] [address] [phone]        Alwyn Peaallwood, Dwayne D, MD. Go on 05/30/2017.   Specialties:  Cardiology, Internal Medicine Why:  Appointment Time: 10:45am Contact information: [address] [facility] [place] [address] [phone]           Management plans discussed with the patient, family and they are in agreement.  CODE STATUS: Prior   TOTAL TIME TAKING CARE OF THIS PATIENT: 45 minutes.    Delfino LovettVipul Abad Manard M.D on 05/18/2017 at 10:34 AM  Between 7am to 6pm - Pager - [phone]  After 6pm go to [email or web address] - password [email or web address]  [facility]  Office  [phone]  CC: Primary care physician; Malva Limes, MD   Note: This dictation was prepared with Dragon dictation along with smaller phrase technology. Any transcriptional errors that result from this process are unintentional.

## 2017-05-20 ENCOUNTER — Telehealth: Payer: Self-pay | Admitting: Family Medicine

## 2017-05-20 NOTE — Telephone Encounter (Signed)
Order for cologuard faxed to [facility] °

## 2017-05-21 ENCOUNTER — Telehealth: Payer: Self-pay | Admitting: Family Medicine

## 2017-05-21 NOTE — Telephone Encounter (Signed)
Order for home sleep study faxed to [facility] °

## 2017-05-22 NOTE — Addendum Note (Signed)
Addended by: Malva LimesFISHER, Xiadani Damman E on: 05/22/2017 05:36 PM   Modules accepted: Orders

## 2017-05-23 ENCOUNTER — Telehealth: Payer: Self-pay

## 2017-05-23 NOTE — Telephone Encounter (Signed)
EMMI Follow-up: Mr. Marchelle GearingRamaswamy said he had just received a call from my number.  It appears he just received the 2nd automated call and stated he had no discharge concerns.  He plans to follow up with his PCP and cardiologist. Originally, flagged on the EMMI report that he had a different number and it has already been updated in Epic and highlighted in bold to reflect the primary contact.

## 2017-05-28 DIAGNOSIS — R0602 Shortness of breath: Secondary | ICD-10-CM | POA: Diagnosis not present

## 2017-05-28 DIAGNOSIS — G4733 Obstructive sleep apnea (adult) (pediatric): Secondary | ICD-10-CM | POA: Diagnosis not present

## 2017-05-29 DIAGNOSIS — R0602 Shortness of breath: Secondary | ICD-10-CM | POA: Diagnosis not present

## 2017-05-29 DIAGNOSIS — G4733 Obstructive sleep apnea (adult) (pediatric): Secondary | ICD-10-CM | POA: Diagnosis not present

## 2017-05-30 DIAGNOSIS — I214 Non-ST elevation (NSTEMI) myocardial infarction: Secondary | ICD-10-CM | POA: Diagnosis not present

## 2017-05-30 DIAGNOSIS — I10 Essential (primary) hypertension: Secondary | ICD-10-CM | POA: Diagnosis not present

## 2017-05-30 DIAGNOSIS — E7800 Pure hypercholesterolemia, unspecified: Secondary | ICD-10-CM | POA: Diagnosis not present

## 2017-05-30 DIAGNOSIS — E669 Obesity, unspecified: Secondary | ICD-10-CM | POA: Diagnosis not present

## 2017-05-31 LAB — COLOGUARD: COLOGUARD: NEGATIVE

## 2017-06-04 ENCOUNTER — Encounter: Payer: Self-pay | Admitting: Family Medicine

## 2017-06-04 ENCOUNTER — Ambulatory Visit: Payer: Federal, State, Local not specified - PPO | Admitting: Family Medicine

## 2017-06-04 VITALS — BP 148/90 | HR 62 | Temp 98.4°F | Resp 16 | Wt 206.0 lb

## 2017-06-04 DIAGNOSIS — I10 Essential (primary) hypertension: Secondary | ICD-10-CM

## 2017-06-04 MED ORDER — LISINOPRIL 5 MG PO TABS: 10.0000 mg | ORAL_TABLET | ORAL | 1 refills | Status: DC

## 2017-06-04 NOTE — Progress Notes (Signed)
Patient: Alex Patterson Male    DOB: November 19, 1960   57 y.o.   MRN: [id number] Visit Date: 06/04/2017  Today's Provider: Mila Merryonald Chia Rock, MD   Chief Complaint  Patient presents with  . Hypertension    follow up   Subjective:    HPI   Hypertension, follow-up:    He was last seen for hypertension 3 weeks ago.  BP at that visit was 162/92. Management since that visit includes adding lisinopril 5mg  a day, however he was hospitalized for chest pain and accelerated hypertension later that day. He has mildly elevated troponin attributed to demand ischemia. Had had normal echocardiogram and Myoview showing mildly reduced ejection fraction and small apical defect c/w mild ischemia. Since then he has had follow up with Dr. Juliann Paresallwood who doubled lisinopril to 2 x 5mg  a day and added metoprolol 25 which he is tolerating well.   He is not having side effects.  He is not exercising. He is adherent to low salt diet.  He typically drinks one cup of coffee every day Outside blood pressures are labile, but states they typically go up in the evening, sometimes in the 180s's/110s on current antihypertensives. He states he has had some sinus pressure and has been taking Coricidin HBP that does not seem to help with sinus pressure. Not taking any other sinus medications. He has taken occasional nitroglycerine which brings blood pressure down drastically.   He denies any change in bowel habits, occasionally feels a little out of it, but no focal neurological symptoms. Feels more fatigued and gets more short of breath on exertion than usual   ------------------------------------------------------------------------    No Known Allergies   Current Outpatient Medications:  .  amLODipine (NORVASC) 5 MG tablet, Take 1 tablet (5 mg total) by mouth daily., Disp: 30 tablet, Rfl: 1 .  aspirin EC 81 MG tablet, Take 81 mg by mouth daily., Disp: , Rfl:  .  fluticasone (FLONASE ALLERGY RELIEF) 50 MCG/ACT  nasal spray, Place 2 sprays into the nose daily., Disp: , Rfl:  .  lisinopril (PRINIVIL,ZESTRIL) 5 MG tablet, Take 1 tablet (5 mg total) by mouth every morning. (Patient taking differently: Take 10 mg by mouth every morning. ), Disp: 30 tablet, Rfl: 2 .  metoprolol succinate (TOPROL-XL) 25 MG 24 hr tablet, Take 1 tablet by mouth daily., Disp: , Rfl: 6 .  nitroGLYCERIN (NITROSTAT) 0.4 MG SL tablet, Place 1 tablet (0.4 mg total) under the tongue every 5 (five) minutes as needed for chest pain., Disp: 15 tablet, Rfl: 2 .  sildenafil (VIAGRA) 50 MG tablet, Take 1-2 tablets (50-100 mg total) by mouth daily as needed., Disp: 8 tablet, Rfl: 2  Review of Systems  Constitutional: Negative for appetite change, chills and fever.  Respiratory: Positive for shortness of breath. Negative for chest tightness and wheezing.   Cardiovascular: Negative for chest pain and palpitations.  Gastrointestinal: Negative for abdominal pain, nausea and vomiting.    Social History   Tobacco Use  . Smoking status: Never Smoker  . Smokeless tobacco: Never Used  Substance Use Topics  . Alcohol use: No    Frequency: Never   Objective:   BP (!) 148/90 (BP Location: Right Arm, Cuff Size: Large)   Pulse 62   Temp 98.4 F (36.9 C) (Oral)   Resp 16   Wt 206 lb (93.4 kg)   SpO2 97% Comment: room air  BMI 32.26 kg/m  Vitals:   06/04/17 1619 06/04/17 1627  BP: (!) 150/90 (!) 148/90  Pulse: 62   Resp: 16   Temp: 98.4 F (36.9 C)   TempSrc: Oral   SpO2: 97%   Weight: 206 lb (93.4 kg)      Physical Exam   General Appearance:    Alert, cooperative, no distress  Eyes:    PERRL, conjunctiva/corneas clear, EOM's intact       Lungs:     Clear to auscultation bilaterally, respirations unlabored  Heart:    Regular rate and rhythm, no murmurs. No carotid bruits.   Neurologic:   Awake, alert, oriented x 3. No apparent focal neurological           defect.           Assessment & Plan:     1. Malignant  hypertension  Continues to have labile home blood pressure readings and persistently elevated office blood pressure on ACEI, CCB and betablocker.   Need to work up for secondary causes of hypertension. Check plasma metanephrines and aldosterone/renin activity.  Consider additional vascular imaging after reviewing labs       Mila Merry, MD  [facility]

## 2017-06-06 ENCOUNTER — Encounter: Payer: Self-pay | Admitting: Family Medicine

## 2017-06-06 ENCOUNTER — Ambulatory Visit (INDEPENDENT_AMBULATORY_CARE_PROVIDER_SITE_OTHER): Payer: Federal, State, Local not specified - PPO | Admitting: Family Medicine

## 2017-06-06 VITALS — BP 150/90 | HR 80 | Temp 99.0°F | Resp 16

## 2017-06-06 DIAGNOSIS — I10 Essential (primary) hypertension: Secondary | ICD-10-CM

## 2017-06-06 DIAGNOSIS — Z23 Encounter for immunization: Secondary | ICD-10-CM | POA: Diagnosis not present

## 2017-06-06 MED ORDER — ISOSORBIDE MONONITRATE ER 30 MG PO TB24: 30.0000 mg | ORAL_TABLET | Freq: Every day | ORAL | 6 refills | Status: DC

## 2017-06-06 NOTE — Progress Notes (Signed)
Patient: Alex Patterson Male    DOB: [birth year]   57 y.o.   MRN: [id number] Visit Date: 06/06/2017  Today's Provider: Mila Merry, MD   Chief Complaint  Patient presents with  . Hypertension   Subjective:    HPI Hypertension: Patient walks in the office today reporting that his blood pressure has been elevated. This morning at home his blood pressure was as high as 196/110 and he experiences fullness on the left side of his chest. He reports BP rapidly comes down after taking nitroglycerine which he last took about an hour before to office this morning. He states he has been having episodes where his feet sweaty, his face feels warm and flushed, he feels fullness in his chest and his blood pressure suddenly escalates. He was seen two days ago when we ordered renal panel which was normal and plasma metanephrines which are still pending.     No Known Allergies   Current Outpatient Medications:  .  amLODipine (NORVASC) 5 MG tablet, Take 1 tablet (5 mg total) by mouth daily., Disp: 30 tablet, Rfl: 1 .  aspirin EC 81 MG tablet, Take 81 mg by mouth daily., Disp: , Rfl:  .  fluticasone (FLONASE ALLERGY RELIEF) 50 MCG/ACT nasal spray, Place 2 sprays into the nose daily., Disp: , Rfl:  .  lisinopril (PRINIVIL,ZESTRIL) 5 MG tablet, Take 2 tablets (10 mg total) by mouth every morning., Disp: 1 tablet, Rfl: 1 .  metoprolol succinate (TOPROL-XL) 25 MG 24 hr tablet, Take 1 tablet by mouth daily., Disp: , Rfl: 6 .  nitroGLYCERIN (NITROSTAT) 0.4 MG SL tablet, Place 1 tablet (0.4 mg total) under the tongue every 5 (five) minutes as needed for chest pain., Disp: 15 tablet, Rfl: 2 .  sildenafil (VIAGRA) 50 MG tablet, Take 1-2 tablets (50-100 mg total) by mouth daily as needed., Disp: 8 tablet, Rfl: 2  Review of Systems  Social History   Tobacco Use  . Smoking status: Never Smoker  . Smokeless tobacco: Never Used  Substance Use Topics  . Alcohol use: No    Frequency: Never    Objective:   BP (!) 150/90 (BP Location: Left Arm, Cuff Size: Large)   Pulse 80   Temp 99 F (37.2 C) (Oral)   Resp 16   SpO2 98% Comment: room air Vitals:   06/06/17 0911 06/06/17 0912  BP: (!) 152/90 (!) 150/90  Pulse: 80   Resp: 16   Temp: 99 F (37.2 C)   TempSrc: Oral   SpO2: 98%      Physical Exam  General appearance: alert, well developed, well nourished, cooperative and in no distress Head: Normocephalic, without obvious abnormality, atraumatic Respiratory: Respirations even and unlabored, normal respiratory rate Extremities: No gross deformities Skin: Skin color, texture, turgor normal. No rashes seen  Psych: Appropriate mood and affect. Neurologic: Mental status: Alert, oriented to person, place, and time, thought content appropriate.  EKG. No ischemic changes.     Assessment & Plan:     1. Malignant hypertension Considering episodic nature of extreme BP elevations, need to rule out pheo and carcinoid syndrome. Plasma metanephrinies are pending. Will obtain CTA abdomen to evaluate renal arteries and image adrenal glands.   Consider elevations of troponin from demand ischemia when hospitalized earlier this month, sustained released nitrates would like be cardioprotective. Will add Imdur and d/c amlodipine. Counseled to absolutely neve take Viagra while on nitrates. If any hypotension we can cut back lisinopril. Consider urine  5-HIAA to rule out carcinoid if metanephrines are normal.  - EKG 12-Lead - isosorbide mononitrate (IMDUR) 30 MG 24 hr tablet; Take 1 tablet (30 mg total) by mouth daily.  Dispense: 30 tablet; Refill: 6 - CT ANGIO ABDOMEN W &/OR WO CONTRAST; Future  2. Need for shingles vaccine  - Varicella-zoster vaccine IM #2       Mila Merryonald Mikahla Wisor, MD  [facility]

## 2017-06-08 ENCOUNTER — Telehealth: Payer: Self-pay | Admitting: Family Medicine

## 2017-06-08 NOTE — Telephone Encounter (Signed)
Can you see if [facility] can ADD free T4 and TSH to labs drawn 06/04/2017. Thanks.

## 2017-06-08 NOTE — Telephone Encounter (Signed)
Added labs, waiting on confirmation from [facility].

## 2017-06-09 LAB — RENAL FUNCTION PANEL
Albumin: 4.7 g/dL (ref 3.5–5.5)
BUN / CREAT RATIO: 17 (ref 9–20)
BUN: 14 mg/dL (ref 6–24)
CO2: 23 mmol/L (ref 20–29)
CREATININE: 0.81 mg/dL (ref 0.76–1.27)
Calcium: 9.3 mg/dL (ref 8.7–10.2)
Chloride: 104 mmol/L (ref 96–106)
GFR calc non Af Amer: 101 mL/min/{1.73_m2} (ref >59)
GFR, EST AFRICAN AMERICAN: 116 mL/min/{1.73_m2} (ref >59)
Glucose: 94 mg/dL (ref 65–99)
Phosphorus: 3.6 mg/dL (ref 2.5–4.5)
Potassium: 4.2 mmol/L (ref 3.5–5.2)
Sodium: 143 mmol/L (ref 134–144)

## 2017-06-09 LAB — METANEPHRINES, PLASMA
Metanephrine, Free: <10 pg/mL (ref 0–62)
NORMETANEPHRINE FREE: 36 pg/mL (ref 0–145)

## 2017-06-09 LAB — ALDOSTERONE + RENIN ACTIVITY W/ RATIO
ALDOS/RENIN RATIO: 4.1 (ref 0.0–30.0)
ALDOSTERONE: 3.7 ng/dL (ref 0.0–30.0)
Renin: 0.912 ng/mL/hr (ref 0.167–5.380)

## 2017-06-10 ENCOUNTER — Telehealth: Payer: Self-pay

## 2017-06-10 ENCOUNTER — Other Ambulatory Visit: Payer: Self-pay

## 2017-06-10 ENCOUNTER — Emergency Department
Admission: EM | Admit: 2017-06-10 | Discharge: 2017-06-10 | Disposition: A | Discharge status: Home / Self Care | Payer: Federal, State, Local not specified - PPO | Attending: Student in an Organized Health Care Education/Training Program | Admitting: Student in an Organized Health Care Education/Training Program

## 2017-06-10 ENCOUNTER — Encounter: Payer: Self-pay | Admitting: Emergency Medicine

## 2017-06-10 ENCOUNTER — Emergency Department: Payer: Federal, State, Local not specified - PPO

## 2017-06-10 DIAGNOSIS — I10 Essential (primary) hypertension: Secondary | ICD-10-CM | POA: Insufficient documentation

## 2017-06-10 DIAGNOSIS — Z7982 Long term (current) use of aspirin: Secondary | ICD-10-CM | POA: Diagnosis not present

## 2017-06-10 DIAGNOSIS — Z79899 Other long term (current) drug therapy: Secondary | ICD-10-CM | POA: Insufficient documentation

## 2017-06-10 DIAGNOSIS — K760 Fatty (change of) liver, not elsewhere classified: Secondary | ICD-10-CM | POA: Diagnosis not present

## 2017-06-10 LAB — CBC
HEMATOCRIT: 41.3 % (ref 40.0–52.0)
HEMOGLOBIN: 14.2 g/dL (ref 13.0–18.0)
MCH: 27.9 pg (ref 26.0–34.0)
MCHC: 34.2 g/dL (ref 32.0–36.0)
MCV: 81.5 fL (ref 80.0–100.0)
Platelets: 189 10*3/uL (ref 150–440)
RBC: 5.07 MIL/uL (ref 4.40–5.90)
RDW: 12.9 % (ref 11.5–14.5)
WBC: 4.9 10*3/uL (ref 3.8–10.6)

## 2017-06-10 LAB — COMPREHENSIVE METABOLIC PANEL
ALBUMIN: 4.3 g/dL (ref 3.5–5.0)
ALK PHOS: 79 U/L (ref 38–126)
ALT: 27 U/L (ref 17–63)
AST: 23 U/L (ref 15–41)
Anion gap: 7 (ref 5–15)
BUN: 11 mg/dL (ref 6–20)
CO2: 28 mmol/L (ref 22–32)
CREATININE: 0.9 mg/dL (ref 0.61–1.24)
Calcium: 9.4 mg/dL (ref 8.9–10.3)
Chloride: 105 mmol/L (ref 101–111)
Glucose, Bld: 100 mg/dL — ABNORMAL HIGH (ref 65–99)
POTASSIUM: 3.9 mmol/L (ref 3.5–5.1)
Sodium: 140 mmol/L (ref 135–145)
TOTAL PROTEIN: 7.6 g/dL (ref 6.5–8.1)
Total Bilirubin: 0.7 mg/dL (ref 0.3–1.2)

## 2017-06-10 LAB — TROPONIN I

## 2017-06-10 MED ORDER — AMLODIPINE BESYLATE 5 MG PO TABS
10.0000 mg | ORAL_TABLET | Freq: Once | ORAL | Status: AC
  Administered 2017-06-10: 10 mg via ORAL
  Filled 2017-06-10: qty 2

## 2017-06-10 MED ORDER — AMLODIPINE BESYLATE 5 MG PO TABS: 5.0000 mg | ORAL_TABLET | Freq: Every day | ORAL | 1 refills | Status: DC

## 2017-06-10 MED ORDER — IOPAMIDOL (ISOVUE-370) INJECTION 76%
100.0000 mL | Freq: Once | INTRAVENOUS | Status: AC | PRN
  Administered 2017-06-10: 100 mL via INTRAVENOUS
  Filled 2017-06-10: qty 100

## 2017-06-10 NOTE — Discharge Instructions (Signed)
Please contact Dr. Theodis AguasFisher's office for close follow-up.  Again her CT abdomen showed no evidence of secondary causes of your elevated blood pressure.  Please return to the ER if you develop any weakness, slurred speech, headache, shortness of breath, chest pain or for any additional questions or concerns.  As discussed with Dr. Sherrie MustacheFisher, will write another prescription for amlodipine for blood pressure management.

## 2017-06-10 NOTE — ED Triage Notes (Signed)
States was diagnosed with HTN first of March. Is currently taking lisinopril, metoprolol and isosorbide. States today blood pressure running high. Was at Dr. Theodis AguasFisher's office today and they sent him due to continued high readings.

## 2017-06-10 NOTE — ED Notes (Signed)
ED Provider at bedside. 

## 2017-06-10 NOTE — Telephone Encounter (Signed)
Patient called stating his blood pressure has been elevated since about 2:30pm today. The systolic reading has been as high as 200. Patient has taken 3 doses for Nitroglycerine and his blood pressure is still not coming down. Patient denies any chest pain, headache, blurred vision, shortness of breath or numbness. Patient was advised to stop by the office now for a nurse blood pressure check. His blood pressure in the office was 198/108. Patient was advised that he should go to the ER for evaluation since his blood pressure was not coming down after taking Nitroglycerin. Patient verbally voiced understanding and went straight over to the ER.

## 2017-06-10 NOTE — ED Notes (Addendum)
Pt reports "sudden spike" in BP at 3pm today. States it was high 190s systollically. States he is being treated for hypertension and was placed on medications exactly a month ago. Pt states BP has been relatively stable until today. States he was "just sitting and typing at work" when it came on. Felt like "a wave" came over him - started sweating; face flushed. Took 3 SL nitro with no improvement.  Meds: lisinopril 10mg  QD, metoprolol ER 25 mg QD, and Isosorbide mononitrate 30 mg ER QD.

## 2017-06-10 NOTE — ED Provider Notes (Signed)
[facility] Emergency Department Provider Note    First MD Initiated Contact with Patient 06/10/17 1742     (approximate)  I have reviewed the triage vital signs and the nursing notes.   HISTORY  Chief Complaint Hypertension    HPI Alex Patterson is a 57 y.o. male with a history of accelerated hypertension presenting with chief complaint of flushed sensation as well as swelling in his armpits groins and shakiness.  States that this happened multiple times.  Patient is having evaluation as outpatient for causes of secondary hypertension.  He scheduled for outpatient CT tomorrow to exclude aureus or tumor.  He denies any chest pain.  No blurry vision.  No headache.  No shortness of breath.  No orthopnea.  No lower extremity swelling.  No numbness or tingling.  Past Medical History:  Diagnosis Date  . Disorder of eustachian tube 02/17/2008  . Hypertension   . Hypogonadism male 03/12/2009   04/25/10 Testosterone, Serum=214 ng/dL (Abn: L) [id number] Free Testosterone(Direct)= 3.9 pg/mL (abn: L) n=6.8-21.5    . Vitamin D deficiency    Family History  Problem Relation Age of Onset  . Lung cancer Mother   . Stroke Father    Past Surgical History:  Procedure Laterality Date  . APPENDECTOMY  2001  . NASAL SINUS SURGERY  2003  . undescended left testicle  1973   Patient Active Problem List   Diagnosis Date Noted  . NSTEMI (non-ST elevated myocardial infarction) (HCC) 05/15/2017  . Obesity (BMI 30.0-34.9) 05/15/2017  . ED (erectile dysfunction) 03/19/2017  . Vitamin D deficiency 07/29/2015  . Pure hypercholesterolemia 04/28/2008  . Family history of ischemic heart disease 04/26/2008  . Accelerated hypertension 04/26/2008  . Allergic rhinitis due to pollen 06/08/2005      Prior to Admission medications   Medication Sig Start Date End Date Taking? Authorizing Provider  amLODipine (NORVASC) 5 MG tablet Take 1 tablet (5 mg total) by mouth daily. 06/10/17  06/10/18  Willy Eddy, MD  aspirin EC 81 MG tablet Take 81 mg by mouth daily.    [provider]  fluticasone (FLONASE ALLERGY RELIEF) 50 MCG/ACT nasal spray Place 2 sprays into the nose daily.    [provider]  isosorbide mononitrate (IMDUR) 30 MG 24 hr tablet Take 1 tablet (30 mg total) by mouth daily. 06/06/17   Malva Limes, MD  lisinopril (PRINIVIL,ZESTRIL) 5 MG tablet Take 2 tablets (10 mg total) by mouth every morning. 06/04/17   Malva Limes, MD  metoprolol succinate (TOPROL-XL) 25 MG 24 hr tablet Take 1 tablet by mouth daily. 05/30/17   [provider]  nitroGLYCERIN (NITROSTAT) 0.4 MG SL tablet Place 1 tablet (0.4 mg total) under the tongue every 5 (five) minutes as needed for chest pain. 05/17/17   Malva Limes, MD    Allergies Patient has no known allergies.    Social History Social History   Tobacco Use  . Smoking status: Never Smoker  . Smokeless tobacco: Never Used  Substance Use Topics  . Alcohol use: No    Frequency: Never  . Drug use: No    Review of Systems Patient denies headaches, rhinorrhea, blurry vision, numbness, shortness of breath, chest pain, edema, cough, abdominal pain, nausea, vomiting, diarrhea, dysuria, fevers, rashes or hallucinations unless otherwise stated above in HPI. ____________________________________________   PHYSICAL EXAM:  VITAL SIGNS: Vitals:   06/10/17 1945 06/10/17 2000  BP: (!) 182/116 (!) 169/110  Pulse: 72   Resp: 18  Temp:    SpO2: 97% 97%    Constitutional: Alert and oriented. Well appearing and in no acute distress. Eyes: Conjunctivae are normal.  Head: Atraumatic. Nose: No congestion/rhinnorhea. Mouth/Throat: Mucous membranes are moist.   Neck: No stridor. Painless ROM.  Cardiovascular: Normal rate, regular rhythm. Grossly normal heart sounds.  Good peripheral circulation. Respiratory: Normal respiratory effort.  No retractions. Lungs CTAB. Gastrointestinal: Soft and  nontender. No distention. No abdominal bruits. No CVA tenderness. Genitourinary:  Musculoskeletal: No lower extremity tenderness nor edema.  No joint effusions. Neurologic:  Normal speech and language. No gross focal neurologic deficits are appreciated. No facial droop Skin:  Skin is warm, dry and intact. No rash noted. Psychiatric: Mood and affect are normal. Speech and behavior are normal.  ____________________________________________   LABS (all labs ordered are listed, but only abnormal results are displayed)  Results for orders placed or performed during the hospital encounter of 06/10/17 (from the past 24 hour(s))  CBC     Status: None   Collection Time: 06/10/17  5:10 PM  Result Value Ref Range   WBC 4.9 3.8 - 10.6 K/uL   RBC 5.07 4.40 - 5.90 MIL/uL   Hemoglobin 14.2 13.0 - 18.0 g/dL   HCT 09.841.3 11.940.0 - 14.752.0 %   MCV 81.5 80.0 - 100.0 fL   MCH 27.9 26.0 - 34.0 pg   MCHC 34.2 32.0 - 36.0 g/dL   RDW 82.912.9 56.211.5 - 13.014.5 %   Platelets 189 150 - 440 K/uL  Troponin I     Status: None   Collection Time: 06/10/17  5:10 PM  Result Value Ref Range   Troponin I <0.03 <0.03 ng/mL  Comprehensive metabolic panel     Status: Abnormal   Collection Time: 06/10/17  5:10 PM  Result Value Ref Range   Sodium 140 135 - 145 mmol/L   Potassium 3.9 3.5 - 5.1 mmol/L   Chloride 105 101 - 111 mmol/L   CO2 28 22 - 32 mmol/L   Glucose, Bld 100 (H) 65 - 99 mg/dL   BUN 11 6 - 20 mg/dL   Creatinine, Ser 8.650.90 0.61 - 1.24 mg/dL   Calcium 9.4 8.9 - 78.410.3 mg/dL   Total Protein 7.6 6.5 - 8.1 g/dL   Albumin 4.3 3.5 - 5.0 g/dL   AST 23 15 - 41 U/L   ALT 27 17 - 63 U/L   Alkaline Phosphatase 79 38 - 126 U/L   Total Bilirubin 0.7 0.3 - 1.2 mg/dL   GFR calc non Af Amer >60 >60 mL/min   GFR calc Af Amer >60 >60 mL/min   Anion gap 7 5 - 15   ____________________________________________  EKG My review and personal interpretation at Time: 17:12   Indication: htn  Rate: 70  Rhythm: sinus Axis: normal  Other:  incomplete rbbb, no stemi, nonspecific st abn, unchanged from previous ____________________________________________  RADIOLOGY  I personally reviewed all radiographic images ordered to evaluate for the above acute complaints and reviewed radiology reports and findings.  These findings were personally discussed with the patient.  Please see medical record for radiology report.  ____________________________________________   PROCEDURES  Procedure(s) performed:  Procedures    Critical Care performed: no ____________________________________________   INITIAL IMPRESSION / ASSESSMENT AND PLAN / ED COURSE  Pertinent labs & imaging results that were available during my care of the patient were reviewed by me and considered in my medical decision making (see chart for details).  DDX: htnive urgency, ras, pheo, medication effect,  noncompliance  Alex Patterson is a 57 y.o. who presents to the ED with Non-distressed patient presenting with concern for elevated BP. Patient is AF,VSS with HTN in ED. Exam as above. Given current presentation have considered the above differential. no report of missed antihypertensive doses or medical non-compliance. no report of illicit drug use that could elevate BP. Extensive evaluation of possible end organ damage pursued in ED. no evidence of acute renal dysfunction. Neuro exam without focal deficits. EKG without evidence of ischemia. Trop negqative. Renal function normal. Not consistent with CHF, malignant htn, adrenergic crisis or hypertensive emergency.  CT imaging ordered to rule out evidence of Pheo, RAS or other cause of secondary hypertension.  CT shows no acute abnormality  Will have patient add amlodipine after discussing with his primary, Dr. Sherrie Mustache, to medications and follow up with PCP for BP recheck. Discussed strict return parameters.       As part of my medical decision making, I reviewed the following data within the electronic medical  record:  Nursing notes reviewed and incorporated, Labs reviewed, notes from prior ED visits and [place] Controlled Substance Database   ____________________________________________   FINAL CLINICAL IMPRESSION(S) / ED DIAGNOSES  Final diagnoses:  Accelerated hypertension      NEW MEDICATIONS STARTED DURING THIS VISIT:  Discharge Medication List as of 06/10/2017  7:40 PM    START taking these medications   Details  amLODipine (NORVASC) 5 MG tablet Take 1 tablet (5 mg total) by mouth daily., Starting Mon 06/10/2017, Until Tue 06/10/2018, Print         Note:  This document was prepared using Dragon voice recognition software and may include unintentional dictation errors.    Willy Eddy, MD 06/10/17 2306

## 2017-06-10 NOTE — ED Notes (Signed)
CT aware that pt is ready for scan; IV in place.

## 2017-06-11 ENCOUNTER — Telehealth: Payer: Self-pay | Admitting: Family Medicine

## 2017-06-11 ENCOUNTER — Ambulatory Visit: Admission: RE | Admit: 2017-06-11 | Payer: Federal, State, Local not specified - PPO | Source: Ambulatory Visit

## 2017-06-11 ENCOUNTER — Encounter: Payer: Self-pay | Admitting: Family Medicine

## 2017-06-11 DIAGNOSIS — R232 Flushing: Secondary | ICD-10-CM

## 2017-06-11 DIAGNOSIS — I10 Essential (primary) hypertension: Secondary | ICD-10-CM | POA: Diagnosis not present

## 2017-06-11 MED ORDER — DOXAZOSIN MESYLATE 1 MG PO TABS: ORAL_TABLET | ORAL | 0 refills | Status: DC

## 2017-06-11 NOTE — Telephone Encounter (Signed)
Patient advised and agrees with plan. Lab requisition placed up front for pick up.

## 2017-06-11 NOTE — Telephone Encounter (Signed)
Discussed results of CTA with patient. Have ordered urinary test to rule out Cushing's, Carcinoid, and pheo. Will add low dose alpha blocker and reduce betablocker to 1/2 tablet a day. Continue lisinopril, amlodipine, and isosorbide for the time being. Anticipate weaning isosorbide when we titrate up alpha blocker.

## 2017-06-11 NOTE — Telephone Encounter (Signed)
Please advise patient that since CT angiogram was normal. We need to test for Carcinoid syndrome and Cushing's syndrome, which are rare causes of flushing and severe hypertension.  These tests require a 24 urine collection so he needs to stop by lab to pick up urine collection kit. Please print pended orders and leave at lab for him to pick up. He should schedule o.v. For follow up 2 days after finishing collection.

## 2017-06-12 ENCOUNTER — Other Ambulatory Visit: Payer: Self-pay

## 2017-06-12 DIAGNOSIS — I10 Essential (primary) hypertension: Secondary | ICD-10-CM

## 2017-06-12 DIAGNOSIS — R232 Flushing: Secondary | ICD-10-CM

## 2017-06-12 LAB — URINALYSIS
Bilirubin, UA: NEGATIVE
GLUCOSE, UA: NEGATIVE
Leukocytes, UA: NEGATIVE
Nitrite, UA: NEGATIVE
PROTEIN UA: NEGATIVE
RBC UA: NEGATIVE
Urobilinogen, Ur: 1 mg/dL (ref 0.2–1.0)
pH, UA: 5.5 (ref 5.0–7.5)

## 2017-06-13 ENCOUNTER — Encounter: Payer: Self-pay | Admitting: Family Medicine

## 2017-06-13 LAB — TSH: TSH: 2.9 u[IU]/mL (ref 0.450–4.500)

## 2017-06-13 LAB — SPECIMEN STATUS REPORT

## 2017-06-13 LAB — T4, FREE: FREE T4: 1.62 ng/dL (ref 0.82–1.77)

## 2017-06-14 ENCOUNTER — Telehealth: Payer: Self-pay | Admitting: *Deleted

## 2017-06-14 ENCOUNTER — Ambulatory Visit: Payer: Federal, State, Local not specified - PPO

## 2017-06-14 ENCOUNTER — Telehealth: Payer: Self-pay | Admitting: Family Medicine

## 2017-06-14 LAB — CORTISOL, URINE, FREE
CORTISOL,F,UG/L,U: 9 ug/L
Cortisol (Ur), Free: 22 ug/24 hr (ref 5–64)

## 2017-06-14 LAB — 5 HIAA, QUANTITATIVE, URINE, 24 HOUR
5-HIAA, Ur: 1.2 mg/L
5-HIAA,QUANT.,24 HR URINE: 2.9 mg/(24.h) (ref 0.0–14.9)

## 2017-06-14 NOTE — Telephone Encounter (Signed)
No, it was done when he was in the ER.

## 2017-06-14 NOTE — Telephone Encounter (Signed)
Pt's appointment for CTA was cancelled due to pt being in the hospital.Does this test need to be rescheduled ?

## 2017-06-14 NOTE — Telephone Encounter (Signed)
-----   Message from Malva Limesonald E Fisher, MD sent at 06/14/2017  2:00 PM EDT ----- Cologuard is negative. Repeat in 3 years. Test for Carcinoid syndrome is negative, which is good news. Rest of test results should be completed by Monday Please see if he can schedule follow up Monday or Tuesday to go over test results and work on tapering off some of his BP medications. We should be able to him down to 2 medications.

## 2017-06-14 NOTE — Telephone Encounter (Signed)
Pt informed and voiced understanding of results. appt scheduled.

## 2017-06-17 ENCOUNTER — Encounter: Payer: Self-pay | Admitting: Family Medicine

## 2017-06-17 ENCOUNTER — Ambulatory Visit: Payer: Federal, State, Local not specified - PPO | Admitting: Family Medicine

## 2017-06-17 VITALS — BP 140/100 | HR 82 | Temp 98.6°F | Resp 16 | Wt 207.0 lb

## 2017-06-17 DIAGNOSIS — I10 Essential (primary) hypertension: Secondary | ICD-10-CM

## 2017-06-17 MED ORDER — LISINOPRIL 5 MG PO TABS: 5.0000 mg | ORAL_TABLET | ORAL | 1 refills | Status: DC

## 2017-06-17 MED ORDER — DOXAZOSIN MESYLATE 1 MG PO TABS: 2.0000 mg | ORAL_TABLET | Freq: Every day | ORAL | 0 refills | Status: DC

## 2017-06-17 NOTE — Progress Notes (Signed)
Patient: Alex Patterson Male    DOB: 1960-12-25   57 y.o.   MRN: [id number] Visit Date: 06/17/2017  Today's Provider: Mila Merryonald Trixy Loyola, MD   Chief Complaint  Patient presents with  . Follow-up   Subjective:    HPI   Follow up ER visit  Patient was seen in ER for HTN on 06/10/2017. BP Readings from Last 6 Encounters:  06/10/17 (!) 169/110  06/06/17 (!) 150/90  06/04/17 (!) 148/90  05/16/17 124/73  05/14/17 (!) 162/92    He was treated for; accelerated hypertension. CT angiogram was normal. He was started back on amlodipine and 1/2 mg doxazocin with instructions to taper Imdur based on blood pressure He reports good compliance with treatment. Home blood pressures have been 140s to 150s. Not having any dizziness. States that last evening was the first dose of Imdur that he was able to reduce to 1/2 tablet.  He continues on 1/2 x 25mg  metoprolol, 5mg  lisinopril, 5mg  amlodipine.  He has since had negative 24 hour urine 5-HIAA and cortisol. But slightly elevated 24 hour urine VMA of 7.9 (n=0-7.5)  ----------------------------------------------------------------    No Known Allergies   Current Outpatient Medications:  .  amLODipine (NORVASC) 5 MG tablet, Take 1 tablet (5 mg total) by mouth daily., Disp: 30 tablet, Rfl: 1 .  aspirin EC 81 MG tablet, Take 81 mg by mouth daily., Disp: , Rfl:  .  doxazosin (CARDURA) 1 MG tablet, Take 1 tablet every evening, Disp: 15 tablet, Rfl: 0 .  fluticasone (FLONASE ALLERGY RELIEF) 50 MCG/ACT nasal spray, Place 2 sprays into the nose daily., Disp: , Rfl:  .  isosorbide mononitrate (IMDUR) 30 MG 24 hr tablet, Take 1/2-1 tablet (30 mg total) by mouth daily., Disp: 30 tablet, Rfl: 6 .  lisinopril (PRINIVIL,ZESTRIL) 5 MG tablet, Take 1 tablets (10 mg total) by mouth every morning., Disp: 1 tablet, Rfl: 1 .  metoprolol succinate (TOPROL-XL) 25 MG 24 hr tablet, Take 12.5 mg by mouth daily. , Disp: , Rfl: 6 .  nitroGLYCERIN (NITROSTAT) 0.4 MG SL  tablet, Place 1 tablet (0.4 mg total) under the tongue every 5 (five) minutes as needed for chest pain., Disp: 15 tablet, Rfl: 2  Review of Systems  Constitutional: Negative for appetite change, chills and fever.  Respiratory: Negative for chest tightness, shortness of breath and wheezing.   Cardiovascular: Negative for chest pain and palpitations.  Gastrointestinal: Negative for abdominal pain, nausea and vomiting.    Social History   Tobacco Use  . Smoking status: Never Smoker  . Smokeless tobacco: Never Used  Substance Use Topics  . Alcohol use: No    Frequency: Never   Objective:   BP (!) 140/100 (BP Location: Right Leg, Cuff Size: Large)   Pulse 82   Temp 98.6 F (37 C) (Oral)   Resp 16   Wt 207 lb (93.9 kg)   SpO2 96%   BMI 32.42 kg/m  Vitals:   06/17/17 1416 06/17/17 1418 06/17/17 1419  BP: 110/78 118/82 (right arm) (!) 140/100   Pulse: 82    Resp: 16    Temp: 98.6 F (37 C)    TempSrc: Oral    SpO2: 96%    Weight: 207 lb (93.9 kg)       Physical Exam  General appearance: alert, well developed, well nourished, cooperative and in no distress Head: Normocephalic, without obvious abnormality, atraumatic Respiratory: Respirations even and unlabored, normal respiratory rate Extremities: No gross deformities Skin:  Skin color, texture, turgor normal. No rashes seen  Psych: Appropriate mood and affect. Neurologic: Mental status: Alert, oriented to person, place, and time, thought content appropriate.     Assessment & Plan:     1. Paroxysmal hypertension Pheochromocytoma versus psuedo-pheochromocytoma  Increase doxazosin to 2 x 1mg  every evening and discontinue Imdur. Consider reducing amlodipine if home BP stays below 130. Continue 5mg  lisinopril for now.   Suspect urinary VMA elevation is related to betablocker. Considering patient has not been tachycardic, will work on weaning off of metoprolol as BP improves, then repeat 24 hour urine metanephrines.        Mila Merry, MD  [facility]

## 2017-06-18 LAB — CATECHOLAMINE+VMA, 24-HR URINE
Dopamine , 24H Ur: 374 ug/24 hr (ref 0–510)
Dopamine, Rand Ur: 156 ug/L
EPINEPHRINE 24H UR: 5 ug/(24.h) (ref 0–20)
EPINEPHRINE RAND UR: 2 ug/L
NOREPINEPH RAND UR: 49 ug/L
NOREPINEPHRINE 24H UR: 118 ug/(24.h) (ref 0–135)
VMA 24H UR ADULT: 7.9 mg/(24.h) — AB (ref 0.0–7.5)
VMA, Urine: 3.3 mg/L

## 2017-06-21 ENCOUNTER — Telehealth: Payer: Self-pay | Admitting: Family Medicine

## 2017-06-21 MED ORDER — DOXAZOSIN MESYLATE 2 MG PO TABS: 2.0000 mg | ORAL_TABLET | Freq: Every day | ORAL | 0 refills | Status: DC

## 2017-06-21 NOTE — Telephone Encounter (Signed)
Patient called saying he is almost out of Doxazosin and Dr. Sherrie MustacheFisher was going to possibly change the mg. of medication when refilling.    [facility] [address]

## 2017-06-21 NOTE — Telephone Encounter (Signed)
Please review request. It looks like the last office visit on 06/17/2017 you increased this medication to 2mg  daily.

## 2017-06-21 NOTE — Telephone Encounter (Signed)
Patient reports systolic blood pressures have been mostly 110s to 130s during the day, but 140s-150s in the evening. Has taken 1/2 tablet isosorbide a few times this week.  Advised to d/c isosorbide. Sent in new prescription for 2mg  daily of doxazosin and may increase to 2 tablets if SBP exceeds 170. Will reasses in 1-2 weeks.

## 2017-06-28 ENCOUNTER — Telehealth: Payer: Self-pay | Admitting: Family Medicine

## 2017-06-28 DIAGNOSIS — I248 Other forms of acute ischemic heart disease: Secondary | ICD-10-CM

## 2017-06-28 NOTE — Telephone Encounter (Signed)
Contacted patient by phone. He has increase doxazosin to 2 tablets daily and had nor more flushing episodes. Has discontinued isosorbide. Reports systolic BP consistently in the 120s and 130s.  Advised to take 1/2 metoprolol every other day for one week then d/c Call if any systolic BP over 272150.  Anticipated repeat checking urine VMA 1-2 weeks after discontinuation of metoprolol.

## 2017-07-01 ENCOUNTER — Telehealth: Payer: Self-pay | Admitting: Family Medicine

## 2017-07-01 MED ORDER — DOXAZOSIN MESYLATE 4 MG PO TABS: 4.0000 mg | ORAL_TABLET | Freq: Every day | ORAL | 1 refills | Status: DC

## 2017-07-01 NOTE — Telephone Encounter (Signed)
Patient advised and verbally voiced understanding. Patient states he would call back later to schedule a follow up appointment since he doesn't know what his work schedule will look like around that time.

## 2017-07-01 NOTE — Telephone Encounter (Signed)
Please advise patient that went ahead and sent prescription for 4 mg tablets of doxazosin to [facility] since his 2 mg tablets will be running out soon. He should only need to take ONE daily since we are changing from 2mg  to 4mg  tablets. Call if he sees BP running consistently over 160. Otherwise follow up in 3-4 weeks

## 2017-07-23 ENCOUNTER — Telehealth: Payer: Self-pay

## 2017-07-23 DIAGNOSIS — I10 Essential (primary) hypertension: Secondary | ICD-10-CM

## 2017-07-23 MED ORDER — AMLODIPINE BESYLATE 5 MG PO TABS: 5.0000 mg | ORAL_TABLET | Freq: Every day | ORAL | 1 refills | Status: DC

## 2017-07-23 NOTE — Telephone Encounter (Signed)
Yes, go ahead and start back on amlodipine, have sent refill to [facility]. So he can start back on it tonight.   Also he needs to repeat 24 hour urine for catecholamines. Have entered order. He needs to stop by lab to pick up collection jug.

## 2017-07-23 NOTE — Telephone Encounter (Signed)
Patient advised as below and agrees to restart Amlodipine and repeat the 24 hour urine test. Patient will be by the office tomorrow to pick up a collection kit. Patient know to have his blood pressure checked at the time he comes in to pick up the kit.

## 2017-07-23 NOTE — Telephone Encounter (Signed)
Patient called saying that his BP is starting to be really high again. He reports that he is taking his medication as directed, but when asked if he was taking amlodipine the patient replied, "that prescription ran out." Patient reports that he has not been on the amlodipine for at least 10 days.   He reports that his DBP has been over 100 for the last 3-5 days. He denies chest pain, nausea, upper back pain, shortness of breath with exertion, headaches, or numbness and tingling in his extremities. He does feel "flushed" in his face with occasional dizzy spells. Patient would like to know if he should start back on amlodipine, or should he adjust his other medications? Please advise. Thanks!

## 2017-07-23 NOTE — Telephone Encounter (Signed)
Also, have him stop by for BP check when he gets urine collection kit. Thanks.

## 2017-07-24 ENCOUNTER — Telehealth: Payer: Self-pay

## 2017-07-24 DIAGNOSIS — I10 Essential (primary) hypertension: Secondary | ICD-10-CM | POA: Diagnosis not present

## 2017-07-24 NOTE — Telephone Encounter (Signed)
Please advise 

## 2017-07-24 NOTE — Telephone Encounter (Signed)
Patient walked in office today for a nurse blood pressure check, patient states that he felt fine today and had no questions or concerns to address with physician. Patient states that yesterday his blood pressure was 191/110. Patient denied symptoms of headache, dizziness, chest pain or blurred vision. Today patients blood pressure was 138/86 with Heart rate of 56, patient reports good compliance and tolerance on medication for blood pressure. KW

## 2017-07-31 LAB — CATECHOLAMINE+VMA, 24-HR URINE
Dopamine , 24H Ur: 367 ug/24 hr (ref 0–510)
Dopamine, Rand Ur: 193 ug/L
EPINEPHRINE 24H UR: 10 ug/(24.h) (ref 0–20)
EPINEPHRINE RAND UR: 5 ug/L
NOREPINEPH RAND UR: 55 ug/L
Norepinephrine, 24H Ur: 105 ug/24 hr (ref 0–135)
VMA 24H UR ADULT: 9.7 mg/(24.h) — AB (ref 0.0–7.5)
VMA, URINE: 5.1 mg/L

## 2017-08-02 ENCOUNTER — Other Ambulatory Visit: Payer: Self-pay | Admitting: Family Medicine

## 2017-08-02 DIAGNOSIS — R825 Elevated urine levels of drugs, medicaments and biological substances: Secondary | ICD-10-CM

## 2017-08-02 DIAGNOSIS — R232 Flushing: Secondary | ICD-10-CM

## 2017-08-02 DIAGNOSIS — I10 Essential (primary) hypertension: Secondary | ICD-10-CM

## 2017-08-05 LAB — HOMOVANILLIC ACID, 24-HR URINE
HVA, Urine: 2.9 mg/L
Homovanillic Acid, 24H Ur: 5.5 mg/24 hr (ref 0.0–10.0)

## 2017-08-05 LAB — SPECIMEN STATUS REPORT

## 2017-08-16 ENCOUNTER — Ambulatory Visit: Payer: Federal, State, Local not specified - PPO | Admitting: Family Medicine

## 2017-08-16 ENCOUNTER — Encounter: Payer: Self-pay | Admitting: Family Medicine

## 2017-08-16 VITALS — BP 118/78 | HR 86 | Temp 98.0°F | Resp 16 | Wt 211.0 lb

## 2017-08-16 DIAGNOSIS — S93492A Sprain of other ligament of left ankle, initial encounter: Secondary | ICD-10-CM | POA: Diagnosis not present

## 2017-08-16 DIAGNOSIS — R232 Flushing: Secondary | ICD-10-CM | POA: Diagnosis not present

## 2017-08-16 DIAGNOSIS — I10 Essential (primary) hypertension: Secondary | ICD-10-CM | POA: Diagnosis not present

## 2017-08-16 NOTE — Progress Notes (Signed)
Patient: Alex Patterson Male    DOB: 1960/08/23   57 y.o.   MRN: [id number] Visit Date: 08/16/2017  Today's Provider: Mila Merryonald Fisher, MD   No chief complaint on file.  Subjective:    Foot Injury   The incident occurred 5 to 7 days ago (5 days ago). The incident occurred in the street. The injury mechanism was a twisting injury. The pain is present in the right ankle. The quality of the pain is described as aching. The pain is at a severity of 3/10. The pain is mild. The pain has been fluctuating since onset. He reports no foreign bodies present. The symptoms are aggravated by movement and weight bearing. He has tried ice and elevation for the symptoms. The treatment provided mild relief.    Patient states he was running on 08/11/2017 ago along side of road when right ankle inverted causing him to fall. He thinks he may have felt a pop or a snap. The outside of ankle quickly swelled, but he was able to ambulated. He started applying ice and keeping foot elevated. The sweeling improved after a few days, but [place] days this week and was on feet walking quite a bit and ankle has swollen back up since. He has been able to walk and bear full weight without assistance of use of cane or crutches. Has not used any compression devices. .   No Known Allergies   Current Outpatient Medications:  .  amLODipine (NORVASC) 5 MG tablet, Take 1 tablet (5 mg total) by mouth daily., Disp: 30 tablet, Rfl: 1 .  aspirin EC 81 MG tablet, Take 81 mg by mouth daily., Disp: , Rfl:  .  doxazosin (CARDURA) 4 MG tablet, Take 1 tablet (4 mg total) by mouth daily. In the evening., Disp: 30 tablet, Rfl: 1 .  fluticasone (FLONASE ALLERGY RELIEF) 50 MCG/ACT nasal spray, Place 2 sprays into the nose daily., Disp: , Rfl:  .  lisinopril (PRINIVIL,ZESTRIL) 5 MG tablet, Take 1 tablet (5 mg total) by mouth every morning., Disp: 1 tablet, Rfl: 1 .  nitroGLYCERIN (NITROSTAT) 0.4 MG SL tablet, Place 1  tablet (0.4 mg total) under the tongue every 5 (five) minutes as needed for chest pain., Disp: 15 tablet, Rfl: 2  Review of Systems  Constitutional: Negative for appetite change, chills and fever.  Respiratory: Negative for chest tightness, shortness of breath and wheezing.   Cardiovascular: Negative for chest pain and palpitations.  Gastrointestinal: Negative for abdominal pain, nausea and vomiting.    Social History   Tobacco Use  . Smoking status: Never Smoker  . Smokeless tobacco: Never Used  Substance Use Topics  . Alcohol use: No    Frequency: Never   Objective:   BP 118/78 (BP Location: Right Arm, Patient Position: Sitting, Cuff Size: Large)   Pulse 86   Temp 98 F (36.7 C) (Oral)   Resp 16   Wt 211 lb (95.7 kg)   SpO2 97%   BMI 33.05 kg/m    Physical Exam  Moderate localized swelling over right lateral ankle. Tenderness of lateral ankle and dorsal foot with and pain with passive inversion of ankle. No erythema. No broken skin. Fully bears weight without assistance.     Assessment & Plan:     1. Sprain of anterior talofibular ligament of left ankle, initial encounter Slowly improved, but excessive amount of time on feet and walking on vacation this week has  exacerbated injury. He is not to run for at least 3 weeks, limit walking until swelling resolves. Keep ankle elevated when not ambulating. Applied Coban and to keep some sort of compression on support around ankle whenever he is on feet until swelling has resolved. Call if symptoms change or if not rapidly improving.    2. Paroxysmal hypertension Very well controlled on current medication regiment. Referral to endocrinology pending due to flushing and elevated urinary VMA  3. Flushing Much better since starting alpha blocker. Endocrinology referral is pending.        Mila Merry, MD  [facility]

## 2017-08-20 ENCOUNTER — Other Ambulatory Visit: Payer: Self-pay | Admitting: Family Medicine

## 2017-08-20 DIAGNOSIS — I10 Essential (primary) hypertension: Secondary | ICD-10-CM

## 2017-08-20 MED ORDER — DOXAZOSIN MESYLATE 4 MG PO TABS: 4.0000 mg | ORAL_TABLET | Freq: Every day | ORAL | 5 refills | Status: DC

## 2017-08-20 MED ORDER — AMLODIPINE BESYLATE 5 MG PO TABS: 5.0000 mg | ORAL_TABLET | Freq: Every day | ORAL | 5 refills | Status: DC

## 2017-08-23 ENCOUNTER — Other Ambulatory Visit: Payer: Self-pay | Admitting: Family Medicine

## 2017-09-02 DIAGNOSIS — M175 Other unilateral secondary osteoarthritis of knee: Secondary | ICD-10-CM | POA: Diagnosis not present

## 2017-09-02 DIAGNOSIS — E6609 Other obesity due to excess calories: Secondary | ICD-10-CM | POA: Diagnosis not present

## 2017-09-02 DIAGNOSIS — E7800 Pure hypercholesterolemia, unspecified: Secondary | ICD-10-CM | POA: Diagnosis not present

## 2017-09-02 DIAGNOSIS — I10 Essential (primary) hypertension: Secondary | ICD-10-CM | POA: Diagnosis not present

## 2017-10-17 DIAGNOSIS — H43392 Other vitreous opacities, left eye: Secondary | ICD-10-CM | POA: Diagnosis not present

## 2017-10-22 ENCOUNTER — Encounter: Payer: Self-pay | Admitting: Endocrinology

## 2017-10-22 ENCOUNTER — Ambulatory Visit: Payer: Federal, State, Local not specified - PPO | Admitting: Endocrinology

## 2017-10-22 VITALS — BP 112/69 | HR 57 | Ht 67.0 in | Wt 206.0 lb

## 2017-10-22 DIAGNOSIS — I10 Essential (primary) hypertension: Secondary | ICD-10-CM | POA: Diagnosis not present

## 2017-10-22 NOTE — Progress Notes (Signed)
Subjective:    Patient ID: Alex Patterson, male    DOB: 01/19/1961, 57 y.o.   MRN: [id number]  HPI Pt is referred by Dr Sherrie MustacheFisher, for possible pheochromocytoma.  In early 2019, chronic HTN suddenly worsened.  He reports 5 mos ago, the onset of intermitt (a few times per week) moderate flushing in the face, and assoc excessive diaphoresis.  He is unable to cite precip or relieving factor.  He says sxs are much better over the past 2 mos.  He has no h/o adrenal disease, diabetes, migraine headache, MTC, alcohol withdrawal, neurofibromas, hemangiomas, brain aneurysm, cocaine use, or thyroid disease.   Past Medical History:  Diagnosis Date  . Disorder of eustachian tube 02/17/2008  . Hypertension   . Hypogonadism male 03/12/2009   04/25/10 Testosterone, Serum=214 ng/dL (Abn: L) [id number]=[phone] Free Testosterone(Direct)= 3.9 pg/mL (abn: L) n=6.8-21.5    . Vitamin D deficiency     Past Surgical History:  Procedure Laterality Date  . APPENDECTOMY  2001  . NASAL SINUS SURGERY  2003  . undescended left testicle  1973    Social History   Socioeconomic History  . Marital status: Married    Spouse name: Not on file  . Number of children: Not on file  . Years of education: [employer]  . Highest education level: Not on file  Occupational History  . Occupation: Audiological scientistederal Attorney  Social Needs  . Financial resource strain: Not on file  . Food insecurity:    Worry: Not on file    Inability: Not on file  . Transportation needs:    Medical: Not on file    Non-medical: Not on file  Tobacco Use  . Smoking status: Never Smoker  . Smokeless tobacco: Never Used  Substance and Sexual Activity  . Alcohol use: No    Frequency: Never  . Drug use: No  . Sexual activity: Yes  Lifestyle  . Physical activity:    Days per week: Not on file    Minutes per session: Not on file  . Stress: Not on file  Relationships  . Social connections:    Talks on phone: Not on file    Gets together: Not on file   Attends religious service: Not on file    Active member of club or organization: Not on file    Attends meetings of clubs or organizations: Not on file    Relationship status: Not on file  . Intimate partner violence:    Fear of current or ex partner: Not on file    Emotionally abused: Not on file    Physically abused: Not on file    Forced sexual activity: Not on file  Other Topics Concern  . Not on file  Social History Narrative  . Not on file    Current Outpatient Medications on File Prior to Visit  Medication Sig Dispense Refill  . amLODipine (NORVASC) 5 MG tablet Take 1 tablet (5 mg total) by mouth daily. 30 tablet 5  . aspirin EC 81 MG tablet Take 81 mg by mouth daily.    Marland Kitchen. doxazosin (CARDURA) 4 MG tablet TAKE 1 TABLET(4 MG) BY MOUTH DAILY IN THE EVENING 30 tablet 5  . fluticasone (FLONASE ALLERGY RELIEF) 50 MCG/ACT nasal spray Place 2 sprays into the nose daily.    Marland Kitchen. lisinopril (PRINIVIL,ZESTRIL) 5 MG tablet Take 1 tablet (5 mg total) by mouth every morning. 1 tablet 1  . nitroGLYCERIN (NITROSTAT) 0.4 MG SL tablet Place 1 tablet (0.4  mg total) under the tongue every 5 (five) minutes as needed for chest pain. 15 tablet 2   No current facility-administered medications on file prior to visit.     No Known Allergies  Family History  Problem Relation Age of Onset  . Lung cancer Mother   . Stroke Father     BP 112/69 (BP Location: Left Arm, Patient Position: Sitting, Cuff Size: Normal)   Pulse (!) 57   Ht 5\' 7"  (1.702 m)   Wt 206 lb (93.4 kg)   SpO2 98%   BMI 32.26 kg/m    Review of Systems denies headache, flushing, pallor, n/v, syncope, diarrhea, weight loss, chest pain, sob, anxiety, visual loss, palpitations, fever, arthralgias, rhinorrhea, easy bruising, and excessive diaphoresis.       Objective:   Physical Exam VS: see vs page  112/69 GEN: no distress HEAD: head: no deformity eyes: no periorbital swelling, no proptosis external nose and ears are  normal mouth: no lesion seen NECK: supple, thyroid is not enlarged CHEST WALL: no deformity LUNGS: clear to auscultation CV: reg rate and rhythm, no murmur ABD: abdomen is soft, nontender.  no hepatosplenomegaly.  not distended.  no hernia MUSCULOSKELETAL: muscle bulk and strength are grossly normal.  no obvious joint swelling.  gait is normal and steady EXTEMITIES: no deformity.  no ulcer on the feet.  feet are of normal color and temp.  no edema PULSES: dorsalis pedis intact bilat.  no carotid bruit NEURO:  cn 2-12 grossly intact.   readily moves all 4's.  sensation is intact to touch on the feet SKIN:  Normal texture and temperature.  No rash.  There is a 7x3 cm cafe-au-lait spot on the right upper arm NODES:  None palpable at the neck PSYCH: alert, well-oriented.  Does not appear anxious nor depressed.  CT (2019): Adrenal glands and kidneys are unremarkable.  Lab Results  Component Value Date   TSH 2.900 06/04/2017    I have reviewed outside records, and summarized: Pt was noted to have worsening HTN, and sxs, and referred here.  Ankle sprain was also addressed.        Assessment & Plan:  HTN.  Reason for worsening is unclear.   Flushing, new, uncertain etiology.    Patient Instructions  If you have another episode of symptoms, or if you have another spike in your blood pressure, please do the blood or urine tests--whichever you can.   I would be happy to see you back here as needed.

## 2017-10-22 NOTE — Patient Instructions (Signed)
If you have another episode of symptoms, or if you have another spike in your blood pressure, please do the blood or urine tests--whichever you can.   I would be happy to see you back here as needed.

## 2017-10-28 DIAGNOSIS — I10 Essential (primary) hypertension: Secondary | ICD-10-CM | POA: Diagnosis not present

## 2017-10-28 NOTE — Addendum Note (Signed)
Addended by: Adline MangoSTONE-ELMORE, Mikeisha Lemonds I on: 10/28/2017 04:01 PM   Modules accepted: Orders

## 2017-11-02 LAB — CATECHOLAMINES, FRACTIONATED, PLASMA
Catecholamines, Total: 740 pg/mL
EPINEPHRINE: 35 pg/mL
NOREPINEPHRINE: 705 pg/mL

## 2017-11-06 LAB — CATECHOLAMINES, FRACTIONATED, URINE, 24 HOUR
CALCULATED TOTAL (E+ NE): 85 ug/(24.h) (ref 26–121)
CREATININE, URINE MG/DAY-CATEUR: 1.99 g/(24.h) (ref 0.50–2.15)
DOPAMINE, 24 HR URINE: 292 ug/(24.h) (ref 52–480)
Norepinephrine, 24 hr Ur: 85 mcg/24 h (ref 15–100)
Volume, Urine-VMAUR: 3500 mL

## 2017-11-06 LAB — METANEPHRINES, URINE, 24 HOUR
Metaneph Total, Ur: 629 mcg/24 h (ref 224–832)
Metanephrines, Ur: 129 mcg/24 h (ref 90–315)
Normetanephrine, 24H Ur: 500 mcg/24 h (ref 122–676)
Volume, Urine-VMAUR: 3500 mL

## 2017-11-15 ENCOUNTER — Telehealth: Payer: Self-pay | Admitting: Family Medicine

## 2017-11-15 MED ORDER — CLONIDINE HCL 0.1 MG PO TABS: ORAL_TABLET | ORAL | 5 refills | Status: DC

## 2017-11-15 NOTE — Telephone Encounter (Signed)
Called patient back for more information. Patient stated he has been having occasional spikes in his bp. Patient stated his normal readings are averaging 108/65. Blood pressure readings during spikes are averaging 165/90-185/108. Patient wanted to know what he should do when having these bp spikes? Patient stated the only symptoms he has during bp spikes is a flushed feeling in his face. Please advise?

## 2017-11-15 NOTE — Telephone Encounter (Signed)
Also, need follow up ov in the next month or two.

## 2017-11-15 NOTE — Telephone Encounter (Signed)
Pt called saying he is on blood pressure medication but is still having some spikes in his pressure.  He wants to know if he needs to increase the dose  Please advise  CB#  [phone]  Thanks teri

## 2017-11-15 NOTE — Telephone Encounter (Signed)
Patient was advised. Expressed understanding. Patient stated he will break the lisinopril 5 mg in half for now, since he just picked up a new refill. Patient will call back for rx to be sent to pharmacy for 2.5 mg when he has finished the 5 mg lisinopril. Scheduled follow up for 12/19/2017 @8 :00 am.

## 2017-11-15 NOTE — Telephone Encounter (Signed)
Have sent in prescription for clonidine to take only as needed for BP >160/100. Clonidine will lower pressure quickly and wears off after a few hours.  Also, he should reduce lisinopril to 2.5mg  a day since his baseline blood pressures are low. Can either break the 5mg  tablets in half, or send in prescription for #30 of the 2.5mg  tablets with 3 refills.

## 2017-11-27 DIAGNOSIS — Z23 Encounter for immunization: Secondary | ICD-10-CM | POA: Diagnosis not present

## 2017-12-19 ENCOUNTER — Encounter: Payer: Self-pay | Admitting: Family Medicine

## 2017-12-19 ENCOUNTER — Ambulatory Visit: Payer: Federal, State, Local not specified - PPO | Admitting: Family Medicine

## 2017-12-19 VITALS — BP 138/78 | HR 58 | Temp 98.5°F | Resp 16 | Ht 67.0 in | Wt 206.0 lb

## 2017-12-19 DIAGNOSIS — R232 Flushing: Secondary | ICD-10-CM

## 2017-12-19 DIAGNOSIS — I10 Essential (primary) hypertension: Secondary | ICD-10-CM
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
## 2018-02-09 ENCOUNTER — Other Ambulatory Visit: Payer: Self-pay | Admitting: Family Medicine

## 2018-03-14 ENCOUNTER — Encounter: Payer: Self-pay | Admitting: Family Medicine

## 2018-03-14 ENCOUNTER — Other Ambulatory Visit: Payer: Self-pay | Admitting: Family Medicine

## 2018-03-14 ENCOUNTER — Ambulatory Visit (INDEPENDENT_AMBULATORY_CARE_PROVIDER_SITE_OTHER): Payer: Federal, State, Local not specified - PPO | Admitting: Family Medicine

## 2018-03-14 VITALS — BP 152/96 | HR 96 | Temp 98.9°F | Resp 16 | Wt 197.0 lb

## 2018-03-14 DIAGNOSIS — E7800 Pure hypercholesterolemia, unspecified: Secondary | ICD-10-CM

## 2018-03-14 DIAGNOSIS — R002 Palpitations: Secondary | ICD-10-CM | POA: Diagnosis not present

## 2018-03-14 DIAGNOSIS — R1013 Epigastric pain: Secondary | ICD-10-CM

## 2018-03-14 DIAGNOSIS — Z125 Encounter for screening for malignant neoplasm of prostate: Secondary | ICD-10-CM

## 2018-03-14 DIAGNOSIS — Z23 Encounter for immunization: Secondary | ICD-10-CM | POA: Diagnosis not present

## 2018-03-14 DIAGNOSIS — R000 Tachycardia, unspecified: Secondary | ICD-10-CM

## 2018-03-14 LAB — TROPONIN I: TROPONIN I: 0.01 ng/mL (ref 0.00–0.04)

## 2018-03-14 NOTE — Patient Instructions (Signed)
.   Please bring all of your medications to every appointment so we can make sure that our medication list is the same as yours.   

## 2018-03-14 NOTE — Progress Notes (Signed)
Patient: Alex Patterson Male    DOB: [birth year]   58 y.o.   MRN: [id number] Visit Date: 03/14/2018  Today's Provider: Mila Merryonald Amaiyah Nordhoff, MD   Chief Complaint  Patient presents with  . Tachycardia    Started today  . Heartburn    Started about two weeks ago.   Subjective:     Heartburn  He complains of abdominal pain and heartburn. He reports no chest pain (Pt states his chest pain feels like it is on the "surface."), no choking, no coughing, no nausea or no wheezing. This is a new problem. Episode onset: Started about two weeks ago. The problem occurs constantly. The problem has been gradually worsening. The heartburn wakes him from sleep. Associated symptoms include fatigue. Treatments tried: Pt started Prilosec OTC about four days ago. The treatment provided no relief.  Palpitations   This is a new problem. The current episode started today. Associated symptoms include diaphoresis, malaise/fatigue and numbness (Left arm when he sleeps. ). Pertinent negatives include no chest fullness, chest pain (Pt states his chest pain feels like it is on the "surface."), coughing, dizziness, fever, irregular heartbeat, nausea, shortness of breath or vomiting.   He hasn't really noticed any improvement in abdominal pressure since starting Prilosec OTC. However he states it only occurs after lying down at night, and is relieved but sitting up in chair. Is not really affective by eating, although he hasn't had much of an appetite lately.   Wt Readings from Last 5 Encounters:  03/14/18 197 lb (89.4 kg)  12/19/17 206 lb (93.4 kg)  10/22/17 206 lb (93.4 kg)  08/16/17 211 lb (95.7 kg)  06/17/17 207 lb (93.9 kg)    No Known Allergies   Current Outpatient Medications:  .  amLODipine (NORVASC) 5 MG tablet, TAKE 1 TABLET(5 MG) BY MOUTH DAILY, Disp: 30 tablet, Rfl: 11 .  aspirin EC 81 MG tablet, Take 81 mg by mouth daily., Disp: , Rfl:  .  cloNIDine (CATAPRES) 0.1 MG tablet, Take one tablet as  needed for SBP>160 or DBP>100. May repeat in 30 minutes if needed. Do not exceed 6 tablets in a day., Disp: 30 tablet, Rfl: 5 .  doxazosin (CARDURA) 4 MG tablet, TAKE 1 TABLET(4 MG) BY MOUTH DAILY IN THE EVENING, Disp: 30 tablet, Rfl: 5 .  fluticasone (FLONASE ALLERGY RELIEF) 50 MCG/ACT nasal spray, Place 2 sprays into the nose daily., Disp: , Rfl:  .  lisinopril (PRINIVIL,ZESTRIL) 5 MG tablet, Take 1 tablet (5 mg total) by mouth every morning. (Patient taking differently: Take 2.5 mg by mouth every morning. ), Disp: 1 tablet, Rfl: 1 .  Omeprazole Magnesium (PRILOSEC OTC PO), Take by mouth., Disp: , Rfl:   Review of Systems  Constitutional: Positive for diaphoresis, fatigue and malaise/fatigue. Negative for activity change, appetite change, chills, fever and unexpected weight change.  Respiratory: Negative for apnea, cough, choking, chest tightness, shortness of breath, wheezing and stridor.   Cardiovascular: Positive for palpitations (Pt states his heart rate has been over 100). Negative for chest pain (Pt states his chest pain feels like it is on the "surface.") and leg swelling.  Gastrointestinal: Positive for abdominal pain and heartburn. Negative for abdominal distention, anal bleeding, blood in stool, constipation, diarrhea, nausea, rectal pain and vomiting.  Musculoskeletal: Positive for back pain (Upper center "on the outside"). Negative for arthralgias, gait problem, joint swelling, myalgias, neck pain and neck stiffness.  Neurological: Positive for numbness (Left arm when he sleeps. ).  Negative for dizziness, light-headedness and headaches.    Social History   Tobacco Use  . Smoking status: Never Smoker  . Smokeless tobacco: Never Used  Substance Use Topics  . Alcohol use: No    Frequency: Never      Objective:   BP (!) 152/96 (BP Location: Right Arm, Patient Position: Sitting, Cuff Size: Large)   Pulse 96   Temp 98.9 F (37.2 C) (Oral)   Resp 16   Wt 197 lb (89.4 kg)   BMI  30.85 kg/m     Physical Exam  General Appearance:    Alert, cooperative, no distress  Eyes:    PERRL, conjunctiva/corneas clear, EOM's intact       Lungs:     Clear to auscultation bilaterally, respirations unlabored  Heart:    Regular rate and rhythm  Abdomen:   bowel sounds present and normal in all 4 quadrants, soft, round, nontender or nondistended. No CVA tenderness    ECG- No acute change. Possible old inferior infarct, but unchanged compared to ECG of 06-06-2017.     Assessment & Plan    1. Tachycardia Now resolved, likely anxiety regarding his other current sx.  - EKG 12-Lead  2. Epigastric pain Likely GERD, recently started Prilosec OTC. He is to continue for now, if labs normal and sx not resolved after 14 days of PPI, then consider abdominal imaging.  - CBC - Comprehensive metabolic panel - Amylase - H Pylori, IGM, IGG, IGA AB - Troponin I  3. Pure hypercholesterolemia He had low risk Myocardial perfusion scan in March, but signs of mild inferior ischemia which was probably related to accelerated hypertension he was experiencing at the time.  Discussed getting Coronary Calcium Score to better determine cardiac risk and need for statin. Will see how labs look first.  - Lipid panel  4. Prostate cancer screening  - PSA  5. Palpitations  - Magnesium  6. Need for hepatitis A immunization He may be going to work in [place] for extended period later this year. He is going to check into recommended vaccines, but I recommend at least starting hep A and hep B as he does have signs of fatty liver on abdominal CT done in March.  - Hepatitis A vaccine adult IM  7. Need for hepatitis B booster vaccination  - Hepatitis B vaccine adult IM     Mila Merry, MD  [facility]

## 2018-03-17 ENCOUNTER — Telehealth: Payer: Self-pay

## 2018-03-17 DIAGNOSIS — E7800 Pure hypercholesterolemia, unspecified: Secondary | ICD-10-CM

## 2018-03-17 MED ORDER — ATORVASTATIN CALCIUM 20 MG PO TABS: 20.0000 mg | ORAL_TABLET | Freq: Every day | ORAL | 3 refills | Status: DC

## 2018-03-17 NOTE — Telephone Encounter (Signed)
Tried calling patient. Left message to call back. 

## 2018-03-17 NOTE — Telephone Encounter (Signed)
Patient advised as below and verbally voiced understanding. Patient agrees with treatment plan. Prescription of Atorvastatin sent into the pharmacy. 1 month follow up appointment scheduled 04/23/2018.

## 2018-03-17 NOTE — Telephone Encounter (Signed)
-----   Message from Malva Limes, MD sent at 03/17/2018  1:59 PM EST ----- Cholesterol is high. Total is 247 and needs to be under 200. LDL (bad cholesterol) is 172 and needs to be under 130. Rest of labs are normal.  Need to try atorvastatin 20mg  daily #30, rf x 3 for cholesterol Continue Prilosec OTC for another 1-2 weeks. If abdominal sx improve then stay on Prilosec for three months. If not then let me know and will need to get abdominal imaging studies  Follow up re lipids and second hepatitis vaccine in a month.

## 2018-03-18 LAB — H PYLORI, IGM, IGG, IGA AB: H PYLORI IGG: 0.1 {index_val} (ref 0.00–0.79)

## 2018-03-18 LAB — COMPREHENSIVE METABOLIC PANEL
A/G RATIO: 1.9 (ref 1.2–2.2)
ALBUMIN: 4.6 g/dL (ref 3.5–5.5)
ALK PHOS: 63 IU/L (ref 39–117)
ALT: 20 IU/L (ref 0–44)
AST: 21 IU/L (ref 0–40)
BILIRUBIN TOTAL: 0.5 mg/dL (ref 0.0–1.2)
BUN/Creatinine Ratio: 10 (ref 9–20)
BUN: 9 mg/dL (ref 6–24)
CHLORIDE: 105 mmol/L (ref 96–106)
CO2: 23 mmol/L (ref 20–29)
Calcium: 9.4 mg/dL (ref 8.7–10.2)
Creatinine, Ser: 0.86 mg/dL (ref 0.76–1.27)
GFR calc Af Amer: 113 mL/min/{1.73_m2} (ref >59)
GFR calc non Af Amer: 98 mL/min/{1.73_m2} (ref >59)
Globulin, Total: 2.4 g/dL (ref 1.5–4.5)
Glucose: 104 mg/dL — ABNORMAL HIGH (ref 65–99)
Potassium: 4.1 mmol/L (ref 3.5–5.2)
SODIUM: 144 mmol/L (ref 134–144)
Total Protein: 7 g/dL (ref 6.0–8.5)

## 2018-03-18 LAB — PSA: Prostate Specific Ag, Serum: 1.3 ng/mL (ref 0.0–4.0)

## 2018-03-18 LAB — CBC
Hematocrit: 42.2 % (ref 37.5–51.0)
Hemoglobin: 14.5 g/dL (ref 13.0–17.7)
MCH: 28.5 pg (ref 26.6–33.0)
MCHC: 34.4 g/dL (ref 31.5–35.7)
MCV: 83 fL (ref 79–97)
Platelets: 198 10*3/uL (ref 150–450)
RBC: 5.08 x10E6/uL (ref 4.14–5.80)
RDW: 13.1 % (ref 12.3–15.4)
WBC: 4.7 10*3/uL (ref 3.4–10.8)

## 2018-03-18 LAB — LIPID PANEL
CHOL/HDL RATIO: 4 ratio (ref 0.0–5.0)
Cholesterol, Total: 247 mg/dL — ABNORMAL HIGH (ref 100–199)
HDL: 61 mg/dL (ref >39)
LDL CALC: 172 mg/dL — AB (ref 0–99)
TRIGLYCERIDES: 68 mg/dL (ref 0–149)
VLDL CHOLESTEROL CAL: 14 mg/dL (ref 5–40)

## 2018-03-18 LAB — MAGNESIUM: Magnesium: 2.1 mg/dL (ref 1.6–2.3)

## 2018-03-18 LAB — AMYLASE: AMYLASE: 79 U/L (ref 31–124)

## 2018-03-18 LAB — TROPONIN I: Troponin I: <0.01 ng/mL (ref 0.00–0.04)

## 2018-04-17 ENCOUNTER — Encounter: Payer: Self-pay | Admitting: Family Medicine

## 2018-04-17 ENCOUNTER — Ambulatory Visit: Payer: Federal, State, Local not specified - PPO | Admitting: Family Medicine

## 2018-04-17 VITALS — BP 132/88 | HR 64 | Temp 98.5°F | Resp 16 | Wt 199.0 lb

## 2018-04-17 DIAGNOSIS — J019 Acute sinusitis, unspecified: Secondary | ICD-10-CM

## 2018-04-17 DIAGNOSIS — R42 Dizziness and giddiness: Secondary | ICD-10-CM

## 2018-04-17 MED ORDER — AMOXICILLIN 500 MG PO CAPS: 1000.0000 mg | ORAL_CAPSULE | Freq: Three times a day (TID) | ORAL | 0 refills | Status: AC

## 2018-04-17 MED ORDER — PREDNISONE 20 MG PO TABS: 20.0000 mg | ORAL_TABLET | Freq: Two times a day (BID) | ORAL | 0 refills | Status: AC

## 2018-04-17 NOTE — Progress Notes (Signed)
Patient: Alex Patterson Male    DOB: Apr 03, 1960   58 y.o.   MRN: [id number] Visit Date: 04/17/2018  Today's Provider: Mila Merryonald , MD   Chief Complaint  Patient presents with  . Dizziness   Subjective:     Dizziness  This is a new problem. Episode onset: 5 days ago. The problem occurs intermittently. The problem has been gradually worsening. Associated symptoms include coughing (occasionally productive) and neck pain (tightness in muscles in back of neck). Pertinent negatives include no abdominal pain, chest pain, chills, fever, nausea, sore throat or vomiting. Associated symptoms comments: Fullness on the right side of his head. Exacerbated by: turning head from side to side.  Has been replacing pop ceiling in his home and is using respirator when he is working, but there is still quite a bit of particulates in the air when he stops working. Describes dizziness as sensation of continued motion after he stops turning he head. Had trouble with balance when he got up this morning which is mostly resolved now.  No fevers chills or sweats.   No Known Allergies   Current Outpatient Medications:  .  amLODipine (NORVASC) 5 MG tablet, TAKE 1 TABLET(5 MG) BY MOUTH DAILY, Disp: 30 tablet, Rfl: 11 .  aspirin EC 81 MG tablet, Take 81 mg by mouth daily., Disp: , Rfl:  .  atorvastatin (LIPITOR) 20 MG tablet, Take 1 tablet (20 mg total) by mouth daily., Disp: 30 tablet, Rfl: 3 .  cloNIDine (CATAPRES) 0.1 MG tablet, Take one tablet as needed for SBP>160 or DBP>100. May repeat in 30 minutes if needed. Do not exceed 6 tablets in a day., Disp: 30 tablet, Rfl: 5 .  doxazosin (CARDURA) 4 MG tablet, TAKE 1 TABLET(4 MG) BY MOUTH DAILY IN THE EVENING, Disp: 30 tablet, Rfl: 5 .  fluticasone (FLONASE ALLERGY RELIEF) 50 MCG/ACT nasal spray, Place 2 sprays into the nose daily., Disp: , Rfl:  .  lisinopril (PRINIVIL,ZESTRIL) 5 MG tablet, Take 1 tablet (5 mg total) by mouth every morning. (Patient taking  differently: Take 2.5 mg by mouth every morning. ), Disp: 1 tablet, Rfl: 1 .  Omeprazole Magnesium (PRILOSEC OTC PO), Take by mouth., Disp: , Rfl:   Review of Systems  Constitutional: Negative for appetite change, chills and fever.  HENT: Positive for postnasal drip and sinus pressure (right side). Negative for rhinorrhea, sneezing and sore throat.        Fullness in on the right side of his head  Respiratory: Positive for cough (occasionally productive). Negative for chest tightness, shortness of breath and wheezing.   Cardiovascular: Negative for chest pain and palpitations.  Gastrointestinal: Negative for abdominal pain, nausea and vomiting.  Musculoskeletal: Positive for neck pain (tightness in muscles in back of neck).  Neurological: Positive for dizziness.    Social History   Tobacco Use  . Smoking status: Never Smoker  . Smokeless tobacco: Never Used  Substance Use Topics  . Alcohol use: No    Frequency: Never      Objective:   BP 132/88 (BP Location: Left Arm, Patient Position: Sitting, Cuff Size: Large)   Pulse 64   Temp 98.5 F (36.9 C) (Oral)   Resp 16   Wt 199 lb (90.3 kg)   SpO2 98% Comment: room air  BMI 31.17 kg/m  Vitals:   04/17/18 0831  BP: 132/88  Pulse: 64  Resp: 16  Temp: 98.5 F (36.9 C)  TempSrc: Oral  SpO2: 98%  Weight: 199 lb (90.3 kg)    Physical Exam  General Appearance:    Alert, cooperative, no distress  HENT:   left TM normal without fluid or infection, right TM fluid noted, neck without nodes and nasal mucosa congested  Neck:   FROM, tightness of muscles with flexion and rotation.   Eyes:    PERRL, conjunctiva/corneas clear, EOM's intact       Lungs:     Clear to auscultation bilaterally, respirations unlabored  Heart:    Regular rate and rhythm  Neurologic:   Awake, alert, oriented x 3. No apparent focal neurological           defect.          Assessment & Plan    1. Acute sinusitis, recurrence not specified, unspecified  location  - amoxicillin (AMOXIL) 500 MG capsule; Take 2 capsules (1,000 mg total) by mouth 3 (three) times daily for 5 days.  Dispense: 30 capsule; Refill: 0 - predniSONE (DELTASONE) 20 MG tablet; Take 1 tablet (20 mg total) by mouth 2 (two) times daily with a meal for 5 days.  Dispense: 10 tablet; Refill: 0  2. Dizziness Consistently with mild intermittent vertigo mostly resolve now, likely secondary to upper airway infection as above.   Call if symptoms change or if not rapidly improving.       Mila Merryonald , MD  [facility]

## 2018-04-23 ENCOUNTER — Encounter: Payer: Self-pay | Admitting: Family Medicine

## 2018-04-23 ENCOUNTER — Ambulatory Visit: Payer: Federal, State, Local not specified - PPO | Admitting: Family Medicine

## 2018-04-23 VITALS — BP 110/72 | HR 72 | Temp 98.4°F | Resp 16 | Wt 199.0 lb

## 2018-04-23 DIAGNOSIS — E7800 Pure hypercholesterolemia, unspecified: Secondary | ICD-10-CM

## 2018-04-23 DIAGNOSIS — I10 Essential (primary) hypertension: Secondary | ICD-10-CM

## 2018-04-23 DIAGNOSIS — J301 Allergic rhinitis due to pollen: Secondary | ICD-10-CM | POA: Diagnosis not present

## 2018-04-23 DIAGNOSIS — Z23 Encounter for immunization: Secondary | ICD-10-CM

## 2018-04-23 NOTE — Patient Instructions (Addendum)
.   Please review the attached list of medications and notify my office if there are any errors.    You can stop taking the lisinopril. Let me know if you start seeing blood pressures consistently over 140/90   Avoid saturated fats in diet. Limit red meats to no more than two servings per week. The goal is to get the LDL (bad) cholesterol under 130   If you have any muscle aches or weakness on the the atorvastatin, you can take OTC Coezyme Q10 (CoQ10) 200mg  a day to relieve them   We do have the meningitis vaccine if you need it, but you will need to go to the health departement if you need vaccine for Typhoid or yellow fever

## 2018-04-23 NOTE — Progress Notes (Signed)
Patient: Alex Patterson Male    DOB: 23-Feb-1961   58 y.o.   MRN: [id number] Visit Date: 04/23/2018  Today's Provider: Mila Merry, MD   Chief Complaint  Patient presents with  . Hyperlipidemia  . Immunizations    Pt is due for his second Hep B Vaccine.  . Sinusitis    Pt feels like his sinus symptoms are coming back.    Subjective:     Hyperlipidemia  This is a chronic problem. The problem is uncontrolled. Recent lipid tests were reviewed and are high. There are no known factors aggravating his hyperlipidemia. Pertinent negatives include no chest pain, focal sensory loss, focal weakness, leg pain, myalgias or shortness of breath. Current antihyperlipidemic treatment includes statins. There are no compliance problems.   Sinusitis  This is a recurrent problem. There has been no fever. Associated symptoms include congestion, coughing and sinus pressure. Pertinent negatives include no ear pain, headaches, neck pain, shortness of breath or sore throat.  States the right sided facial fullness and dizziness cleared up pretty quickly after starting amoxicillin and prednisone last week, but is starting to feel a little congested in his head again. Has started using fluticasone nasal spray consistently.   Lab Results  Component Value Date   CHOL 247 (H) 03/14/2018   CHOL 214 (H) 03/14/2017   Lab Results  Component Value Date   HDL 61 03/14/2018   HDL 53 03/14/2017   Lab Results  Component Value Date   LDLCALC 172 (H) 03/14/2018   LDLCALC 149 (H) 03/14/2017   Lab Results  Component Value Date   TRIG 68 03/14/2018   TRIG 61 03/14/2017   Lab Results  Component Value Date   CHOLHDL 4.0 03/14/2018   CHOLHDL 4.0 03/14/2017   No results found for: LDLDIRECT Wt Readings from Last 3 Encounters:  04/23/18 199 lb (90.3 kg)  04/17/18 199 lb (90.3 kg)  03/14/18 197 lb (89.4 kg)      No Known Allergies   Current Outpatient Medications:  .  amLODipine (NORVASC) 5 MG  tablet, TAKE 1 TABLET(5 MG) BY MOUTH DAILY, Disp: 30 tablet, Rfl: 11 .  aspirin EC 81 MG tablet, Take 81 mg by mouth daily., Disp: , Rfl:  .  atorvastatin (LIPITOR) 20 MG tablet, Take 1 tablet (20 mg total) by mouth daily., Disp: 30 tablet, Rfl: 3 .  cloNIDine (CATAPRES) 0.1 MG tablet, Take one tablet as needed for SBP>160 or DBP>100. May repeat in 30 minutes if needed. Do not exceed 6 tablets in a day., Disp: 30 tablet, Rfl: 5 .  doxazosin (CARDURA) 4 MG tablet, TAKE 1 TABLET(4 MG) BY MOUTH DAILY IN THE EVENING, Disp: 30 tablet, Rfl: 5 .  fluticasone (FLONASE ALLERGY RELIEF) 50 MCG/ACT nasal spray, Place 2 sprays into the nose daily., Disp: , Rfl:  .  lisinopril (PRINIVIL,ZESTRIL) 5 MG tablet, Take 1 tablet (5 mg total) by mouth every morning. (Patient taking differently: Take 2.5 mg by mouth every morning. ), Disp: 1 tablet, Rfl: 1 .  Omeprazole Magnesium (PRILOSEC OTC PO), Take by mouth., Disp: , Rfl:   Review of Systems  Constitutional: Negative.   HENT: Positive for congestion, postnasal drip and sinus pressure. Negative for ear discharge, ear pain, sinus pain, sore throat, tinnitus, trouble swallowing and voice change.   Eyes: Negative.   Respiratory: Positive for cough. Negative for apnea, choking, chest tightness, shortness of breath, wheezing and stridor.   Cardiovascular: Negative.  Negative for chest pain.  Gastrointestinal: Negative.   Musculoskeletal: Negative.  Negative for myalgias and neck pain.  Neurological: Positive for light-headedness. Negative for dizziness, focal weakness and headaches.  Hematological: Negative for adenopathy.    Social History   Tobacco Use  . Smoking status: Never Smoker  . Smokeless tobacco: Never Used  Substance Use Topics  . Alcohol use: No    Frequency: Never      Objective:   BP 110/72 (BP Location: Right Arm, Patient Position: Sitting, Cuff Size: Normal)   Pulse 72   Temp 98.4 F (36.9 C) (Oral)   Resp 16   Wt 199 lb (90.3 kg)    BMI 31.17 kg/m  Vitals:   04/23/18 0810  BP: 110/72  Pulse: 72  Resp: 16  Temp: 98.4 F (36.9 C)  TempSrc: Oral  Weight: 199 lb (90.3 kg)     Physical Exam  General appearance: alert, well developed, well nourished, cooperative and in no distress Head: Normocephalic, without obvious abnormality, atraumatic Respiratory: Respirations even and unlabored, normal respiratory rate Extremities: No gross deformities     Assessment & Plan    1. Pure hypercholesterolemia He is tolerating atorvastatin well with no adverse effects.   - Lipid panel - Comprehensive metabolic panel  2. Paroxysmal hypertension Seems to be mediated by adrenergic vasoconstriction. has been very well controlled since addition of doxazosin. Can d/c low dose of lisinopril and continue amlodipine and doxazosin.  - Comprehensive metabolic panel  3. Seasonal allergic rhinitis due to pollen Stay on nasal steroid and encourage regular  Advised he can call for refill of prednisone and amoxicillin if sinus infection flares back up.   4. Need for hepatitis B vaccination  - Hepatitis B vaccine adult IM #2  Return hep B #3 and hep A #2 in July. Consider Menactra if he ends up going to [place]     Mila Merry, MD  [facility]

## 2018-04-24 ENCOUNTER — Telehealth: Payer: Self-pay

## 2018-04-24 LAB — COMPREHENSIVE METABOLIC PANEL
ALBUMIN: 4 g/dL (ref 3.8–4.9)
ALT: 23 IU/L (ref 0–44)
AST: 14 IU/L (ref 0–40)
Albumin/Globulin Ratio: 2.1 (ref 1.2–2.2)
Alkaline Phosphatase: 68 IU/L (ref 39–117)
BUN/Creatinine Ratio: 15 (ref 9–20)
BUN: 15 mg/dL (ref 6–24)
Bilirubin Total: 0.5 mg/dL (ref 0.0–1.2)
CALCIUM: 9.2 mg/dL (ref 8.7–10.2)
CO2: 23 mmol/L (ref 20–29)
Chloride: 104 mmol/L (ref 96–106)
Creatinine, Ser: 1.02 mg/dL (ref 0.76–1.27)
GFR calc Af Amer: 95 mL/min/{1.73_m2} (ref >59)
GFR calc non Af Amer: 82 mL/min/{1.73_m2} (ref >59)
Globulin, Total: 1.9 g/dL (ref 1.5–4.5)
Glucose: 86 mg/dL (ref 65–99)
Potassium: 3.9 mmol/L (ref 3.5–5.2)
Sodium: 143 mmol/L (ref 134–144)
Total Protein: 5.9 g/dL — ABNORMAL LOW (ref 6.0–8.5)

## 2018-04-24 LAB — LIPID PANEL
Chol/HDL Ratio: 2.7 ratio (ref 0.0–5.0)
Cholesterol, Total: 161 mg/dL (ref 100–199)
HDL: 60 mg/dL (ref >39)
LDL Calculated: 92 mg/dL (ref 0–99)
Triglycerides: 47 mg/dL (ref 0–149)
VLDL Cholesterol Cal: 9 mg/dL (ref 5–40)

## 2018-04-24 NOTE — Telephone Encounter (Signed)
-----   Message from Malva Limes, MD sent at 04/24/2018  1:18 PM EST ----- Cholesterol much better, down from 257 to 161. Continue current medications.  Follow up bp check in July as scheduled.

## 2018-04-24 NOTE — Telephone Encounter (Signed)
Pt advised.   Thanks,   -Laura  

## 2018-06-19 IMAGING — DX DG CHEST 1V PORT
1 series · 1 of 1 positions shown · non-contrast
Comparison: None.

CLINICAL DATA: Chest pain.

EXAM:
PORTABLE CHEST 1 VIEW

[chest ap]
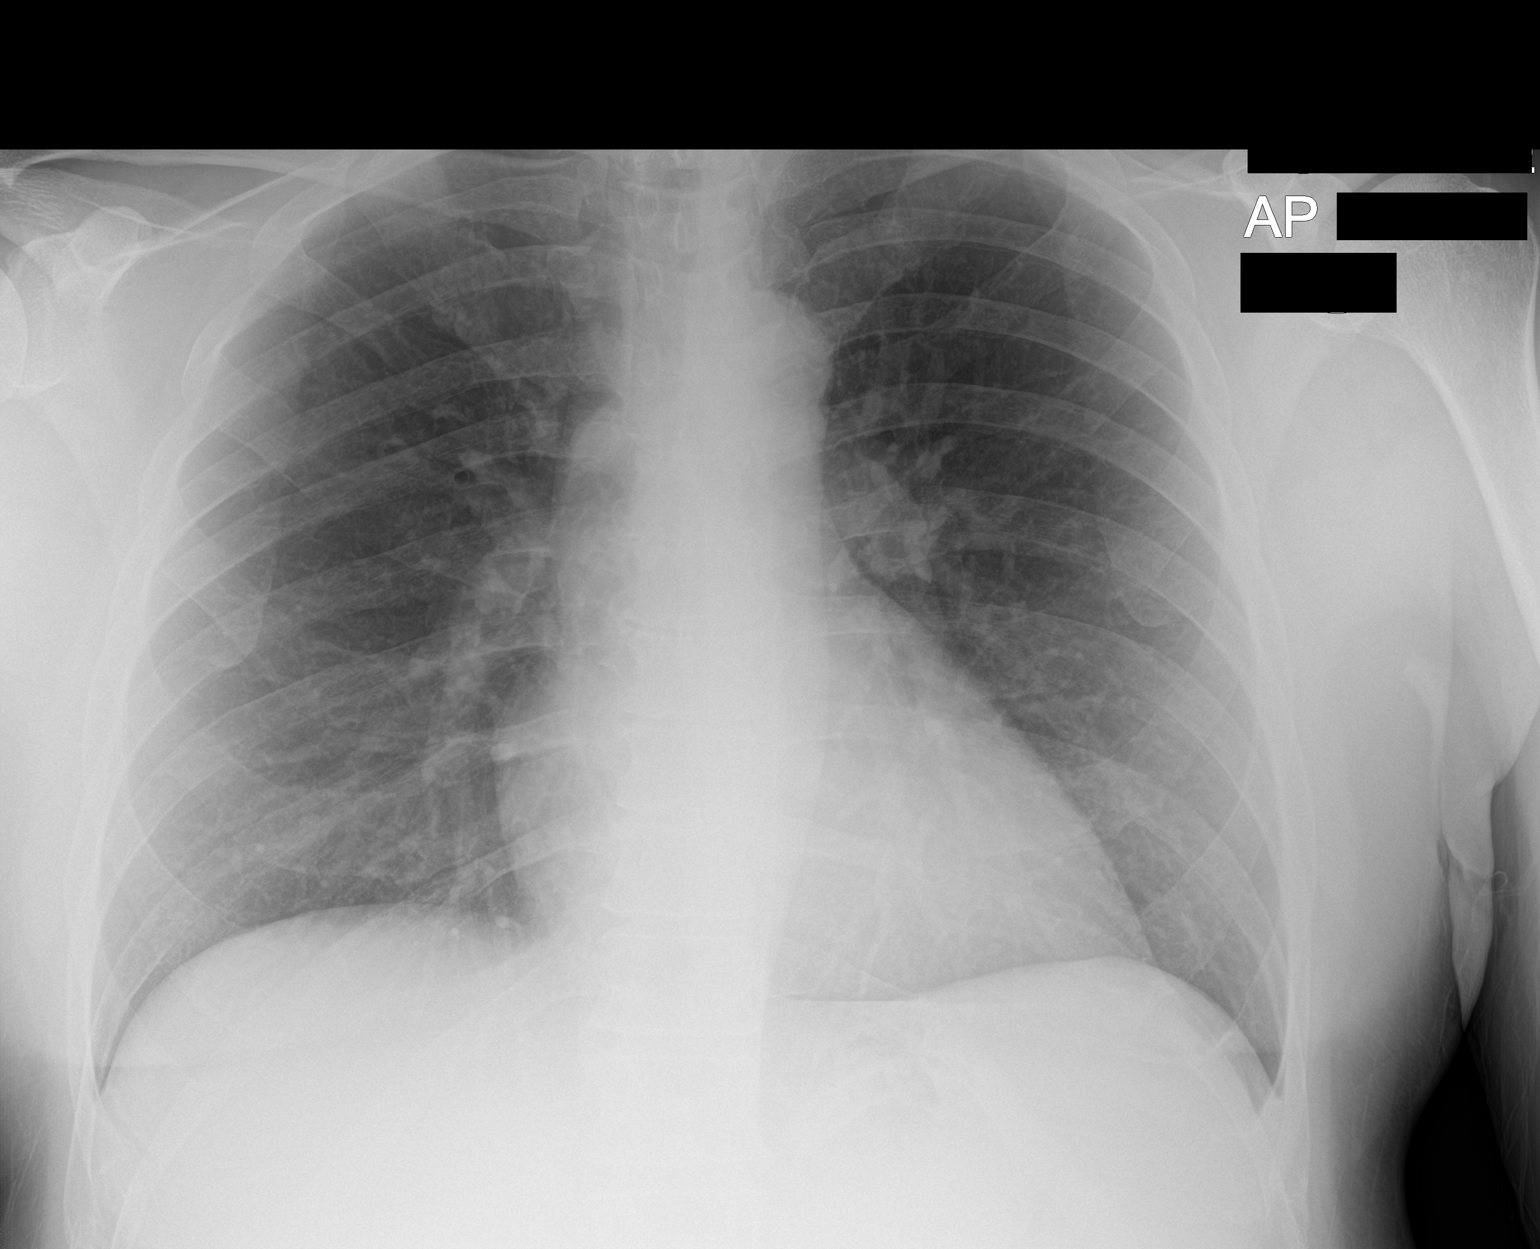

[1 of 1 positions shown; findings below may reference images not displayed]

FINDINGS: The cardiomediastinal contours are normal. The lungs are clear.
Pulmonary vasculature is normal. No consolidation, pleural effusion,
or pneumothorax. No acute osseous abnormalities are seen.
IMPRESSION: Unremarkable AP view of the chest.

## 2018-07-30 ENCOUNTER — Other Ambulatory Visit: Payer: Self-pay | Admitting: Family Medicine

## 2018-09-03 ENCOUNTER — Other Ambulatory Visit: Payer: Self-pay

## 2018-09-03 ENCOUNTER — Encounter: Payer: Self-pay | Admitting: Family Medicine

## 2018-09-03 ENCOUNTER — Ambulatory Visit (INDEPENDENT_AMBULATORY_CARE_PROVIDER_SITE_OTHER): Payer: Federal, State, Local not specified - PPO | Admitting: Family Medicine

## 2018-09-03 VITALS — BP 120/80 | HR 56 | Temp 98.4°F | Resp 16 | Ht 67.0 in | Wt 185.0 lb

## 2018-09-03 DIAGNOSIS — Z0000 Encounter for general adult medical examination without abnormal findings: Secondary | ICD-10-CM

## 2018-09-03 DIAGNOSIS — I10 Essential (primary) hypertension: Secondary | ICD-10-CM

## 2018-09-03 DIAGNOSIS — Z111 Encounter for screening for respiratory tuberculosis: Secondary | ICD-10-CM

## 2018-09-03 DIAGNOSIS — Z114 Encounter for screening for human immunodeficiency virus [HIV]: Secondary | ICD-10-CM | POA: Diagnosis not present

## 2018-09-03 DIAGNOSIS — E7800 Pure hypercholesterolemia, unspecified: Secondary | ICD-10-CM

## 2018-09-03 NOTE — Progress Notes (Signed)
Patient: Alex Patterson, Male    DOB: 1960-09-24, 58 y.o.   MRN: [id number] Visit Date: 09/03/2018  Today's Provider: Mila Merryonald Mariluz Crespo, MD   Chief Complaint  Patient presents with  . Annual Exam  . Hyperlipidemia  . Hypertension   Subjective:     Annual physical exam Alex Patterson is a 58 y.o. male who presents today for health maintenance and complete physical. He feels well. He reports exercising 3-6 days weekly. He reports he is sleeping well. Has lost about 15 pounds through exercising. Rarely checks bp, but is always in 120s to 130s when he checks it. Has not had to take clonidine for several months.   -----------------------------------------------------------------  Lipid/Cholesterol, Follow-up:   Last seen for this 4 months ago.  Management changes since that visit include none. . Last Lipid Panel:    Component Value Date/Time   CHOL 161 04/23/2018 0852   TRIG 47 04/23/2018 0852   HDL 60 04/23/2018 0852   CHOLHDL 2.7 04/23/2018 0852   LDLCALC 92 04/23/2018 0852    Risk factors for vascular disease include hypercholesterolemia and hypertension  He reports fair compliance with treatment. He is having side effects. He was having more muscle fatigue and stopped taking it for a few months. Tried coQ10 which didn't help. He did feel better off of atorvatatin. He started consuming pumpkin seeds for the extra magnesium and just started back on atorvastatin a few days ago.   Weight trend: fluctuating a bit Prior visit with dietician: no Current diet: in general, a "healthy" diet   Current exercise: running/ jogging  Wt Readings from Last 3 Encounters:  09/03/18 185 lb (83.9 kg)  04/23/18 199 lb (90.3 kg)  04/17/18 199 lb (90.3 kg)    -------------------------------------------------------------------  Hypertension, follow-up:  BP Readings from Last 3 Encounters:  09/03/18 120/80  04/23/18 110/72  04/17/18 132/88    He was last seen for  hypertension 4 months ago.  BP at that visit was 110/72. Management since that visit includes discontinuing low dose Lisinopril and continuing Amlodipine and Doxazosin. He reports good compliance with treatment. He is not having side effects.  He is exercising. He is adherent to low salt diet.   Outside blood pressures are not being checked. He is experiencing none.  Patient denies chest pain, chest pressure/discomfort, claudication, dyspnea, exertional chest pressure/discomfort, fatigue, irregular heart beat, lower extremity edema, near-syncope, orthopnea, palpitations, paroxysmal nocturnal dyspnea, syncope and tachypnea.   Cardiovascular risk factors include advanced age (older than 155 for men, 6365 for women), dyslipidemia, hypertension and male gender.  Use of agents associated with hypertension: NSAIDS.     Weight trend: fluctuating a bit Wt Readings from Last 3 Encounters:  09/03/18 185 lb (83.9 kg)  04/23/18 199 lb (90.3 kg)  04/17/18 199 lb (90.3 kg)    Current diet: in general, a "healthy" diet    ------------------------------------------------------------------------     Review of Systems  Constitutional: Negative for appetite change, chills, fatigue and fever.  HENT: Negative for congestion, ear pain, hearing loss, nosebleeds and trouble swallowing.   Eyes: Negative for pain and visual disturbance.  Respiratory: Negative for cough, chest tightness and shortness of breath.   Cardiovascular: Negative for chest pain, palpitations and leg swelling.  Gastrointestinal: Negative for abdominal pain, blood in stool, constipation, diarrhea, nausea and vomiting.  Endocrine: Negative for polydipsia, polyphagia and polyuria.  Genitourinary: Negative for dysuria and flank pain.  Musculoskeletal: Negative for arthralgias, back pain, joint swelling, myalgias  and neck stiffness.  Skin: Negative for color change, rash and wound.  Neurological: Negative for dizziness, tremors, seizures,  speech difficulty, weakness, light-headedness and headaches.  Psychiatric/Behavioral: Negative for behavioral problems, confusion, decreased concentration, dysphoric mood and sleep disturbance. The patient is not nervous/anxious.   All other systems reviewed and are negative.   Social History      He  reports that he has never smoked. He has never used smokeless tobacco. He reports that he does not drink alcohol or use drugs.       Social History   Socioeconomic History  . Marital status: Married    Spouse name: Not on file  . Number of children: 2  . Years of education: Coll Grad  . Highest education level: Not on file  Occupational History  . Occupation: Orthoptist  Social Needs  . Financial resource strain: Not on file  . Food insecurity    Worry: Not on file    Inability: Not on file  . Transportation needs    Medical: Not on file    Non-medical: Not on file  Tobacco Use  . Smoking status: Never Smoker  . Smokeless tobacco: Never Used  Substance and Sexual Activity  . Alcohol use: No    Frequency: Never  . Drug use: No  . Sexual activity: Yes  Lifestyle  . Physical activity    Days per week: Not on file    Minutes per session: Not on file  . Stress: Not on file  Relationships  . Social Herbalist on phone: Not on file    Gets together: Not on file    Attends religious service: Not on file    Active member of club or organization: Not on file    Attends meetings of clubs or organizations: Not on file    Relationship status: Not on file  Other Topics Concern  . Not on file  Social History Narrative  . Not on file    Past Medical History:  Diagnosis Date  . Disorder of eustachian tube 02/17/2008  . Hypertension   . Hypogonadism male 03/12/2009   04/25/10 Testosterone, Serum=214 ng/dL (Abn: L) [phone] Free Testosterone(Direct)= 3.9 pg/mL (abn: L) n=6.8-21.5    . Vitamin D deficiency      Patient Active Problem List   Diagnosis Date Noted   . Demand ischemia of myocardium ([place]) 05/15/2017  . Obesity (BMI 30.0-34.9) 05/15/2017  . ED (erectile dysfunction) 03/19/2017  . Vitamin D deficiency 07/29/2015  . Pure hypercholesterolemia 04/28/2008  . Family history of ischemic heart disease 04/26/2008  . Paroxysmal hypertension 04/26/2008  . Allergic rhinitis due to pollen 06/08/2005    Past Surgical History:  Procedure Laterality Date  . APPENDECTOMY  2001  . NASAL SINUS SURGERY  2003  . undescended left testicle  1973    Family History        Family Status  Relation Name Status  . Mother  Deceased  . Father  Deceased  . Sister  Alive        His family history includes Lung cancer in his mother; Stroke in his father.      No Known Allergies   Current Outpatient Medications:  .  amLODipine (NORVASC) 5 MG tablet, TAKE 1 TABLET(5 MG) BY MOUTH DAILY, Disp: 30 tablet, Rfl: 11 .  aspirin EC 81 MG tablet, Take 81 mg by mouth daily., Disp: , Rfl:  .  atorvastatin (LIPITOR) 20 MG tablet, Take 1 tablet (20  mg total) by mouth daily., Disp: 30 tablet, Rfl: 3 .  cloNIDine (CATAPRES) 0.1 MG tablet, Take one tablet as needed for SBP>160 or DBP>100. May repeat in 30 minutes if needed. Do not exceed 6 tablets in a day., Disp: 30 tablet, Rfl: 5 .  doxazosin (CARDURA) 4 MG tablet, TAKE 1 TABLET(4 MG) BY MOUTH DAILY IN THE EVENING, Disp: 30 tablet, Rfl: 5 .  fluticasone (FLONASE ALLERGY RELIEF) 50 MCG/ACT nasal spray, Place 2 sprays into the nose daily., Disp: , Rfl:    Patient Care Team: Malva LimesFisher, Amana Bouska E, MD as PCP - General (Family Medicine)    Objective:    Vitals: BP 120/80 (BP Location: Left Arm, Patient Position: Sitting, Cuff Size: Large)   Pulse (!) 56   Temp 98.4 F (36.9 C) (Oral)   Resp 16   Ht 5\' 7"  (1.702 m)   Wt 185 lb (83.9 kg)   SpO2 98% Comment: room air  BMI 28.98 kg/m    Vitals:   09/03/18 0858  BP: 120/80  Pulse: (!) 56  Resp: 16  Temp: 98.4 F (36.9 C)  TempSrc: Oral  SpO2: 98%  Weight: 185 lb  (83.9 kg)  Height: 5\' 7"  (1.702 m)     Physical Exam   General Appearance:    Alert, cooperative, no distress, appears stated age  Head:    Normocephalic, without obvious abnormality, atraumatic  Eyes:    PERRL, conjunctiva/corneas clear, EOM's intact, fundi    benign, both eyes       Ears:    Normal TM's and external ear canals, both ears  Nose:   Nares normal, septum midline, mucosa normal, no drainage   or sinus tenderness  Throat:   Lips, mucosa, and tongue normal; teeth and gums normal  Neck:   Supple, symmetrical, trachea midline, no adenopathy;       thyroid:  No enlargement/tenderness/nodules; no carotid   bruit or JVD  Back:     Symmetric, no curvature, ROM normal, no CVA tenderness  Lungs:     Clear to auscultation bilaterally, respirations unlabored  Chest wall:    No tenderness or deformity  Heart:    Regular rate and rhythm, S1 and S2 normal, no murmur, rub   or gallop  Abdomen:     Soft, non-tender, bowel sounds active all four quadrants,    no masses, no organomegaly  Genitalia:    deferred  Rectal:    deferred  Extremities:   Extremities normal, atraumatic, no cyanosis or edema  Pulses:   2+ and symmetric all extremities  Skin:   Skin color, texture, turgor normal, no rashes or lesions  Lymph nodes:   Cervical, supraclavicular, and axillary nodes normal  Neurologic:   CNII-XII intact. Normal strength, sensation and reflexes      throughout    Depression Screen PHQ 2/9 Scores 09/03/2018 04/23/2018 03/13/2017  PHQ - 2 Score 0 1 0  PHQ- 9 Score 0 2 -        Assessment & Plan:     Routine Health Maintenance and Physical Exam  Exercise Activities and Dietary recommendations Goals   None     Immunization History  Administered Date(s) Administered  . Hepatitis A, Adult 03/14/2018  . Hepatitis B, adult 03/14/2018, 04/23/2018  . Td 04/12/1996  . Tdap 03/19/2011  . Zoster Recombinat (Shingrix) 03/13/2017, 06/06/2017    Health Maintenance  Topic Date  Due  . INFLUENZA VACCINE  10/11/2018  . Fecal DNA (Cologuard)  05/30/2020  .  TETANUS/TDAP  03/18/2021  . Hepatitis C Screening  Completed  . HIV Screening  Completed     Discussed health benefits of physical activity, and encouraged him to engage in regular exercise appropriate for his age and condition.    --------------------------------------------------------------------  1. 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 Check required labs today. Completed health forms for [employer].  - QuantiFERON-TB Gold Plus - HIV Antibody (routine testing w rflx) - CBC - Comprehensive metabolic panel  2. Screening-pulmonary TB  - QuantiFERON-TB Gold Plus  3. Encounter for screening for HIV  - HIV Antibody (routine testing w rflx)  4. Paroxysmal hypertension Has had extensive endocrine work. Responded well to combination of amlodipine and doxazosin. 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 Regular exercise and weight loss has likely helped significantly. Continue current medications.    6. Pure hypercholesterolemia Just started back on atorvastatin which he had previously stopped due to muscle fatigue. Advised we can try another statin if sx return.    Mila Merryonald Aaleyah Witherow, MD  [facility]

## 2018-09-03 NOTE — Patient Instructions (Signed)
.   Please review the attached list of medications and notify my office if there are any errors.   . Please bring all of your medications to every appointment so we can make sure that our medication list is the same as yours.   

## 2018-09-05 LAB — QUANTIFERON-TB GOLD PLUS
QuantiFERON Mitogen Value: >10 IU/mL
QuantiFERON Nil Value: 0.01 IU/mL
QuantiFERON TB1 Ag Value: 0.02 IU/mL
QuantiFERON TB2 Ag Value: 0.01 IU/mL
QuantiFERON-TB Gold Plus: NEGATIVE

## 2018-09-05 LAB — CBC
Hematocrit: 42.1 % (ref 37.5–51.0)
Hemoglobin: 14 g/dL (ref 13.0–17.7)
MCH: 27.9 pg (ref 26.6–33.0)
MCHC: 33.3 g/dL (ref 31.5–35.7)
MCV: 84 fL (ref 79–97)
Platelets: 168 10*3/uL (ref 150–450)
RBC: 5.01 x10E6/uL (ref 4.14–5.80)
RDW: 12.6 % (ref 11.6–15.4)
WBC: 4.6 10*3/uL (ref 3.4–10.8)

## 2018-09-05 LAB — COMPREHENSIVE METABOLIC PANEL
ALT: 22 IU/L (ref 0–44)
AST: 22 IU/L (ref 0–40)
Albumin/Globulin Ratio: 2.4 — ABNORMAL HIGH (ref 1.2–2.2)
Albumin: 4.5 g/dL (ref 3.8–4.9)
Alkaline Phosphatase: 67 IU/L (ref 39–117)
BUN/Creatinine Ratio: 12 (ref 9–20)
BUN: 11 mg/dL (ref 6–24)
Bilirubin Total: 0.6 mg/dL (ref 0.0–1.2)
CO2: 22 mmol/L (ref 20–29)
Calcium: 9.3 mg/dL (ref 8.7–10.2)
Chloride: 106 mmol/L (ref 96–106)
Creatinine, Ser: 0.92 mg/dL (ref 0.76–1.27)
GFR calc Af Amer: 108 mL/min/{1.73_m2} (ref >59)
GFR calc non Af Amer: 93 mL/min/{1.73_m2} (ref >59)
Globulin, Total: 1.9 g/dL (ref 1.5–4.5)
Glucose: 101 mg/dL — ABNORMAL HIGH (ref 65–99)
Potassium: 4.1 mmol/L (ref 3.5–5.2)
Sodium: 143 mmol/L (ref 134–144)
Total Protein: 6.4 g/dL (ref 6.0–8.5)

## 2018-09-05 LAB — HIV ANTIBODY (ROUTINE TESTING W REFLEX): HIV Screen 4th Generation wRfx: NONREACTIVE

## 2018-09-08 ENCOUNTER — Telehealth: Payer: Self-pay | Admitting: Family Medicine

## 2018-09-08 NOTE — Telephone Encounter (Signed)
Spoke with pt to relay this message. He will come by tomorrow morning to pick up form. TF

## 2018-09-08 NOTE — Telephone Encounter (Signed)
-----   Message from Birdie Sons, MD sent at 09/07/2018  9:11 AM EDT ----- Labs normal. Form is on my workstation, ready to for patient to pick up

## 2018-09-09 DIAGNOSIS — M175 Other unilateral secondary osteoarthritis of knee: Secondary | ICD-10-CM | POA: Diagnosis not present

## 2018-09-09 DIAGNOSIS — E6609 Other obesity due to excess calories: Secondary | ICD-10-CM | POA: Diagnosis not present

## 2018-09-09 DIAGNOSIS — I10 Essential (primary) hypertension: Secondary | ICD-10-CM | POA: Diagnosis not present

## 2018-09-09 DIAGNOSIS — E7800 Pure hypercholesterolemia, unspecified: Secondary | ICD-10-CM | POA: Diagnosis not present

## 2018-09-23 ENCOUNTER — Ambulatory Visit: Payer: Federal, State, Local not specified - PPO | Admitting: Family Medicine

## 2018-09-30 ENCOUNTER — Ambulatory Visit: Payer: Self-pay | Admitting: Family Medicine

## 2018-10-08 NOTE — Progress Notes (Signed)
Alex Bathnand P Ruggirello  MRN: [id number] DOB: 07/13/1960 Virtual Visit via Video Note  I connected with Alex Patterson on 10/09/18 at  8:20 AM EDT by a video enabled telemedicine application and verified that I am speaking with the correct person using two identifiers.  Location: Patient: Home Provider: Home   I discussed the limitations of evaluation and management by telemedicine and the availability of in person appointments. The patient expressed understanding and agreed to proceed   Subjective:  HPI   Left ear pain/pressure that radiates down his jaw.  He has been having this symptom for about 4-5 days.  He states it is worse at night. Has a history of allergic rhinitis and using Flonase Nasal Spray. Feeling much better today. No cough, fever, congestion, PND, sputum production, loss of taste/smell or sore throat.  Atorvastatin causing muscle aches.  Has been taking this for a few months. Noticed muscle and joint pains after a long run for exercise. Better today.  Blood pressure still elevated but when he takes the Clonidine it makes his heart rate go down. Only uses Clonidine when BP above 160/100. BP 136/77 with pulse of 56 today. Still taking Amlodipine 5 mg qd and Cardura 4 mg qd.   Patient Active Problem List   Diagnosis Date Noted  . Demand ischemia of myocardium ([facility]) 05/15/2017  . Obesity (BMI 30.0-34.9) 05/15/2017  . ED (erectile dysfunction) 03/19/2017  . Vitamin D deficiency 07/29/2015  . Pure hypercholesterolemia 04/28/2008  . Family history of ischemic heart disease 04/26/2008  . Paroxysmal hypertension 04/26/2008  . Allergic rhinitis due to pollen 06/08/2005    Past Medical History:  Diagnosis Date  . Disorder of eustachian tube 02/17/2008  . Hypertension   . Hypogonadism male 03/12/2009   04/25/10 Testosterone, Serum=214 ng/dL (Abn: L) [phone] Free Testosterone(Direct)= 3.9 pg/mL (abn: L) n=6.8-21.5    . Vitamin D deficiency    Past Surgical History:   Procedure Laterality Date  . APPENDECTOMY  2001  . NASAL SINUS SURGERY  2003  . undescended left testicle  1973   Social History   Socioeconomic History  . Marital status: Married    Spouse name: Not on file  . Number of children: 2  . Years of education: Coll Grad  . Highest education level: Not on file  Occupational History  . Occupation: Audiological scientistederal Attorney  Social Needs  . Financial resource strain: Not on file  . Food insecurity    Worry: Not on file    Inability: Not on file  . Transportation needs    Medical: Not on file    Non-medical: Not on file  Tobacco Use  . Smoking status: Never Smoker  . Smokeless tobacco: Never Used  Substance and Sexual Activity  . Alcohol use: No    Frequency: Never  . Drug use: No  . Sexual activity: Yes  Lifestyle  . Physical activity    Days per week: Not on file    Minutes per session: Not on file  . Stress: Not on file  Relationships  . Social Musicianconnections    Talks on phone: Not on file    Gets together: Not on file    Attends religious service: Not on file    Active member of club or organization: Not on file    Attends meetings of clubs or organizations: Not on file    Relationship status: Not on file  . Intimate partner violence    Fear of current or ex partner: Not  on file    Emotionally abused: Not on file    Physically abused: Not on file    Forced sexual activity: Not on file  Other Topics Concern  . Not on file  Social History Narrative  . Not on file    Outpatient Encounter Medications as of 10/09/2018  Medication Sig Note  . amLODipine (NORVASC) 5 MG tablet TAKE 1 TABLET(5 MG) BY MOUTH DAILY   . aspirin EC 81 MG tablet Take 81 mg by mouth daily.   Marland Kitchen atorvastatin (LIPITOR) 20 MG tablet Take 1 tablet (20 mg total) by mouth daily.   . cloNIDine (CATAPRES) 0.1 MG tablet Take one tablet as needed for SBP>160 or DBP>100. May repeat in 30 minutes if needed. Do not exceed 6 tablets in a day.   . doxazosin (CARDURA) 4  MG tablet TAKE 1 TABLET(4 MG) BY MOUTH DAILY IN THE EVENING   . fluticasone (FLONASE ALLERGY RELIEF) 50 MCG/ACT nasal spray Place 2 sprays into the nose daily. 07/29/2015: Received from: [place]: Place into the nose.   No facility-administered encounter medications on file as of 10/09/2018.     No Known Allergies  Review of Systems  Constitutional: Positive for malaise/fatigue. Negative for chills, diaphoresis and fever.       Flushed  HENT: Positive for congestion, ear pain, sinus pain and tinnitus. Negative for ear discharge, hearing loss, nosebleeds and sore throat.   Eyes: Negative for blurred vision, double vision and pain.  Respiratory: Negative for cough, shortness of breath and wheezing.   Cardiovascular: Positive for chest pain (Fullness, using Prilosec, can't tell if it is helping).    Objective:  BP 136/77   Pulse (!) 56   SpO2 95%   WDWN male in no apparent distress.  Head: Normocephalic, atraumatic. Neck: Supple, NROM Respiratory: No apparent distress Psych: Normal mood and affect   Assessment and Plan :  1. Otalgia, left ear Has had some dull ache in the left ear the past 4-5 days without significant URI symptoms. Still using Flonase Nasal Spray prn allergic rhinitis. No pain in ears today and hearing appears normal. May use antihistamine as tolerated. No fever with this episode. Call or return prn.  2. Paroxysmal hypertension Concerned about drop in pulse when he uses the Clonidine. BP in good range today. Had follow up with Dr. Clayborn Bigness (cardiologist) in June 2020 without any drop in pulse during that time. May need to recheck EKG and consider stress test if low pulse (<50) consistently. He agreed to contact Dr. Clayborn Bigness if this occurs.  3. Pure hypercholesterolemia Still taking Lipitor 20 mg qd. Recommend increasing Co-Q 10 daily to try to help with any muscle aches. If persistent, should get labs with CRP and renal function. No  muscle or joint pains today.   Follow Up Instructions:    I discussed the assessment and treatment plan with the patient. The patient was provided an opportunity to ask questions and all were answered. The patient agreed with the plan and demonstrated an understanding of the instructions.   The patient was advised to call back or seek an in-person evaluation if the symptoms worsen or if the condition fails to improve as anticipated.  I provided 15 minutes of non-face-to-face time during this encounter.

## 2018-10-09 ENCOUNTER — Other Ambulatory Visit: Payer: Self-pay

## 2018-10-09 ENCOUNTER — Encounter: Payer: Self-pay | Admitting: Family Medicine

## 2018-10-09 ENCOUNTER — Ambulatory Visit (INDEPENDENT_AMBULATORY_CARE_PROVIDER_SITE_OTHER): Payer: Federal, State, Local not specified - PPO | Admitting: Family Medicine

## 2018-10-09 VITALS — BP 136/77 | HR 56

## 2018-10-09 DIAGNOSIS — H9202 Otalgia, left ear: Secondary | ICD-10-CM | POA: Diagnosis not present

## 2018-10-09 DIAGNOSIS — E7800 Pure hypercholesterolemia, unspecified: Secondary | ICD-10-CM

## 2018-10-09 DIAGNOSIS — I10 Essential (primary) hypertension: Secondary | ICD-10-CM | POA: Diagnosis not present

## 2018-10-15 ENCOUNTER — Other Ambulatory Visit: Payer: Self-pay

## 2018-10-15 ENCOUNTER — Ambulatory Visit (INDEPENDENT_AMBULATORY_CARE_PROVIDER_SITE_OTHER): Payer: Federal, State, Local not specified - PPO | Admitting: Family Medicine

## 2018-10-15 DIAGNOSIS — Z23 Encounter for immunization: Secondary | ICD-10-CM

## 2018-10-17 NOTE — Patient Instructions (Signed)
.   Please review the attached list of medications and notify my office if there are any errors.   . Please bring all of your medications to every appointment so we can make sure that our medication list is the same as yours.   . We will have flu vaccines available after Labor Day. Please go to your pharmacy or call the office in early September to schedule you flu shot.   

## 2018-10-30 ENCOUNTER — Telehealth: Payer: Self-pay | Admitting: Family Medicine

## 2018-10-30 NOTE — Telephone Encounter (Signed)
Pt calling regarding his last 2 EKG's that were done.  Please call pt back to discuss asap.  Thanks, [employer]

## 2018-10-31 NOTE — Telephone Encounter (Signed)
Pt advised.   Thanks,   -Dhara Schepp  

## 2018-11-02 ENCOUNTER — Other Ambulatory Visit: Payer: Self-pay | Admitting: Family Medicine

## 2018-11-02 DIAGNOSIS — E7800 Pure hypercholesterolemia, unspecified: Secondary | ICD-10-CM

## 2018-11-04 ENCOUNTER — Encounter: Payer: Self-pay | Admitting: Family Medicine

## 2018-11-04 ENCOUNTER — Other Ambulatory Visit: Payer: Self-pay

## 2018-11-04 ENCOUNTER — Telehealth: Payer: Self-pay

## 2018-11-04 ENCOUNTER — Ambulatory Visit (INDEPENDENT_AMBULATORY_CARE_PROVIDER_SITE_OTHER): Payer: Federal, State, Local not specified - PPO | Admitting: Family Medicine

## 2018-11-04 VITALS — BP 140/80 | HR 66 | Temp 96.9°F | Resp 16 | Wt 178.0 lb

## 2018-11-04 DIAGNOSIS — K219 Gastro-esophageal reflux disease without esophagitis: Secondary | ICD-10-CM

## 2018-11-04 DIAGNOSIS — I10 Essential (primary) hypertension: Secondary | ICD-10-CM

## 2018-11-04 DIAGNOSIS — E7800 Pure hypercholesterolemia, unspecified: Secondary | ICD-10-CM | POA: Diagnosis not present

## 2018-11-04 MED ORDER — NIZATIDINE 300 MG PO CAPS: 300.0000 mg | ORAL_CAPSULE | Freq: Two times a day (BID) | ORAL | 3 refills | Status: DC

## 2018-11-04 MED ORDER — ROSUVASTATIN CALCIUM 10 MG PO TABS: 10.0000 mg | ORAL_TABLET | Freq: Every day | ORAL | 3 refills | Status: DC

## 2018-11-04 NOTE — Progress Notes (Signed)
Patient: Alex Patterson Male    DOB: 25-Mar-1960   58 y.o.   MRN: [id number] Visit Date: 11/04/2018  Today's Provider: Lelon Huh, MD   Chief Complaint  Patient presents with  . Chest Pain   Subjective:     HPI Chest Discomfort: Patient comes in complaining of having intermittent episodes of chest discomfort. His last episode was last night after dinner that lasted 90 minutes. Patient reports that it is not an actual pain, but more of a discomfort the left side of his chest. He states that last night while having the discomfort , lay on his left side an expericed excessive belching. He had been taking OCT omeprazole which had been effective controlling reflux sx but stopped due to having a very slow heart rate while he was taking, sometimes down into the low 40s. He has been exercising by swimming or walking just about every day. He did follow up with Dr. Clayborn Bigness in June to make sure he didn't have any cardiac disease and was felt that episode of chest pain and elevated troponin last year was entirely due to demand ischemia from accelerated hypertension and not ASCVD. He reports he rarely checks BP anymore, but when he does it is usually in the 120s/70s.   He does report he feels like atorvastatin is causing muscle fatigue, he stops it occasionally and feels much less fatigued when he is off it and would like to change to a different cholesterol medication.    No Known Allergies   Current Outpatient Medications:  .  amLODipine (NORVASC) 5 MG tablet, TAKE 1 TABLET(5 MG) BY MOUTH DAILY, Disp: 30 tablet, Rfl: 11 .  aspirin EC 81 MG tablet, Take 81 mg by mouth daily., Disp: , Rfl:  .  atorvastatin (LIPITOR) 20 MG tablet, TAKE 1 TABLET(20 MG) BY MOUTH DAILY, Disp: 30 tablet, Rfl: 12 .  cloNIDine (CATAPRES) 0.1 MG tablet, Take one tablet as needed for SBP>160 or DBP>100. May repeat in 30 minutes if needed. Do not exceed 6 tablets in a day., Disp: 30 tablet, Rfl: 5 .  doxazosin  (CARDURA) 4 MG tablet, TAKE 1 TABLET(4 MG) BY MOUTH DAILY IN THE EVENING, Disp: 30 tablet, Rfl: 5 .  fluticasone (FLONASE ALLERGY RELIEF) 50 MCG/ACT nasal spray, Place 2 sprays into the nose daily., Disp: , Rfl:   Review of Systems  Constitutional: Negative for appetite change, chills and fever.  Respiratory: Negative for chest tightness, shortness of breath and wheezing.   Cardiovascular: Negative for chest pain and palpitations.       Chest discomfort  Gastrointestinal: Negative for abdominal pain, nausea and vomiting.       Belching    Social History   Tobacco Use  . Smoking status: Never Smoker  . Smokeless tobacco: Never Used  Substance Use Topics  . Alcohol use: No    Frequency: Never      Objective:   BP 140/80 (BP Location: Left Arm, Patient Position: Sitting, Cuff Size: Normal)   Pulse 66   Temp (!) 96.9 F (36.1 C) (Temporal)   Resp 16   Wt 178 lb (80.7 kg)   SpO2 97% Comment: room air  BMI 27.88 kg/m  Vitals:   11/04/18 1555  BP: 140/80  Pulse: 66  Resp: 16  Temp: (!) 96.9 F (36.1 C)  TempSrc: Temporal  SpO2: 97%  Weight: 178 lb (80.7 kg)     Physical Exam   General Appearance:    Alert,  cooperative, no distress  Eyes:    PERRL, conjunctiva/corneas clear, EOM's intact       Lungs:     Clear to auscultation bilaterally, respirations unlabored  Heart:    Normal heart rate. Normal rhythm. No murmurs, rubs, or gallops.   MS:   All extremities are intact.   Neurologic:   Awake, alert, oriented x 3. No apparent focal neurological           defect.           Assessment & Plan    1. Pure hypercholesterolemia Change from atorvastatin to - rosuvastatin (CRESTOR) 10 MG tablet; Take 1 tablet (10 mg total) by mouth daily.  Dispense: 90 tablet; Refill: 3 due to muscle fatigue since being on atorvastatin.   2. Gastroesophageal reflux disease, esophagitis presence not specified Presenting symptoms seemed to be controlled on OTC omeprazole which he stopped  due to bradycardia. I'm not certain that PPI was responsible for that, but will try H2 blocker instead  3. Paroxysmal hypertension Pretty well controlled on amlodipine and doxazosin, he does have clonidine to take prn severely elevated BP, which he very rarely requires.      Mila Merryonald Fisher, MD  [facility]

## 2018-11-04 NOTE — Telephone Encounter (Signed)
Patient states that he maybe having

## 2018-11-07 ENCOUNTER — Other Ambulatory Visit: Payer: Self-pay | Admitting: Family Medicine

## 2018-11-07 MED ORDER — FAMOTIDINE 20 MG PO TABS: 20.0000 mg | ORAL_TABLET | Freq: Two times a day (BID) | ORAL | 5 refills | Status: DC

## 2018-11-07 NOTE — Progress Notes (Signed)
PER PHARMACY NIZATIDINE NOT AVAILABE

## 2018-11-12 DIAGNOSIS — Z03818 Encounter for observation for suspected exposure to other biological agents ruled out: Secondary | ICD-10-CM | POA: Diagnosis not present

## 2018-11-13 NOTE — Patient Instructions (Signed)
.   Please review the attached list of medications and notify my office if there are any errors.   . Please bring all of your medications to every appointment so we can make sure that our medication list is the same as yours.   . It is especially important to get the annual flu vaccine this year. If you haven't had it already, please go to your pharmacy or call the office as soon as possible to schedule you flu shot.  

## 2018-11-28 ENCOUNTER — Encounter: Payer: Self-pay | Admitting: Family Medicine

## 2018-12-01 ENCOUNTER — Encounter: Payer: Self-pay | Admitting: Family Medicine

## 2018-12-01 DIAGNOSIS — B070 Plantar wart: Secondary | ICD-10-CM

## 2018-12-16 ENCOUNTER — Other Ambulatory Visit: Payer: Self-pay

## 2018-12-16 ENCOUNTER — Encounter: Payer: Self-pay | Admitting: Podiatry

## 2018-12-16 ENCOUNTER — Ambulatory Visit: Payer: Federal, State, Local not specified - PPO | Admitting: Podiatry

## 2018-12-16 DIAGNOSIS — B070 Plantar wart: Secondary | ICD-10-CM

## 2018-12-19 NOTE — Progress Notes (Signed)
   Subjective: 58 y.o. male presenting today as a new patient with a chief complaint of a lesion noted to the plantar left foot that appeared about two years ago. He denies pain but states he can sometimes feel something on the foot. He has used OTC wart remover and cut the top part of the lesion off. There are no modifying factors noted. Patient is here for further evaluation and treatment.    Past Medical History:  Diagnosis Date  . Disorder of eustachian tube 02/17/2008  . Hypertension   . Hypogonadism male 03/12/2009   04/25/10 Testosterone, Serum=214 ng/dL (Abn: L) [phone] Free Testosterone(Direct)= 3.9 pg/mL (abn: L) n=6.8-21.5    . Vitamin D deficiency     Objective: Physical Exam General: The patient is alert and oriented x3 in no acute distress.   Dermatology: Hyperkeratotic skin lesion(s) noted to the plantar aspect of the left foot approximately 1 cm in diameter. Pinpoint bleeding noted upon debridement. Skin is warm, dry and supple bilateral lower extremities. Negative for open lesions or macerations.   Vascular: Palpable pedal pulses bilaterally. No edema or erythema noted. Capillary refill within normal limits.   Neurological: Epicritic and protective threshold grossly intact bilaterally.    Musculoskeletal Exam: No significant pain with palpation to the noted skin lesion(s).  Range of motion within normal limits to all pedal and ankle joints bilateral. Muscle strength 5/5 in all groups bilateral.    Assessment: #1 plantar wart left foot   Plan of Care:  #1 Patient was evaluated. #2 Excisional debridement of the plantar wart lesion(s) was performed using a chisel blade. Cantharone was applied and the lesion(s) was dressed with a dry sterile dressing. #3 patient is to return to clinic in 2 weeks.  Works for the [employer]. Going to [place] for two years.    Edrick Kins, DPM [facility]  Dr. Edrick Kins, [place]                                         [place], [place] [address]                Office [phone]  Fax [phone]

## 2019-01-02 ENCOUNTER — Ambulatory Visit (INDEPENDENT_AMBULATORY_CARE_PROVIDER_SITE_OTHER): Payer: Federal, State, Local not specified - PPO | Admitting: Podiatry

## 2019-01-02 ENCOUNTER — Other Ambulatory Visit: Payer: Self-pay

## 2019-01-02 ENCOUNTER — Encounter: Payer: Self-pay | Admitting: Podiatry

## 2019-01-02 DIAGNOSIS — B070 Plantar wart: Secondary | ICD-10-CM | POA: Diagnosis not present

## 2019-01-05 ENCOUNTER — Other Ambulatory Visit: Payer: Self-pay | Admitting: Family Medicine

## 2019-01-07 NOTE — Progress Notes (Signed)
   HPI: 58 y.o. male presenting today for follow-up evaluation regarding plantar wart to the left forefoot.  Last appointment on 12/16/2018 Cantharone was applied.  Patient states that he is doing much better and has no pain.  No new complaints at this time.  Past Medical History:  Diagnosis Date  . Disorder of eustachian tube 02/17/2008  . Hypertension   . Hypogonadism male 03/12/2009   04/25/10 Testosterone, Serum=214 ng/dL (Abn: L) [phone] Free Testosterone(Direct)= 3.9 pg/mL (abn: L) n=6.8-21.5    . Vitamin D deficiency      Physical Exam: General: The patient is alert and oriented x3 in no acute distress.  Dermatology: Skin is warm, dry and supple bilateral lower extremities. Negative for open lesions or macerations.  After debridement of the plantar verruca there continues to be hyperkeratotic wart tissue to the lesion with pinpoint bleeding.  Verruca measures approximately 1 cm in diameter.  Vascular: Palpable pedal pulses bilaterally. No edema or erythema noted. Capillary refill within normal limits.  Neurological: Epicritic and protective threshold grossly intact bilaterally.   Musculoskeletal Exam: Range of motion within normal limits to all pedal and ankle joints bilateral. Muscle strength 5/5 in all groups bilateral.   Assessment: 1.  Plantar verruca left forefoot   Plan of Care:  1. Patient evaluated. 2.  After evaluation of the lesion decision was made today to surgically excise the lesion.  Patient agrees and he would like to have the lesion completely removed. 3.  The forefoot was prepped in an aseptic manner and 56mL of 2% lidocaine plain was utilized in a local block fashion.  The scope was circumscribed using a surgical #11 blade and curettage was utilized to completely remove the wart lesion.  Dry sterile dressings were then applied. 4.  Return to clinic in 2 weeks      Edrick Kins, DPM [facility]  Dr. Edrick Kins, DPM    [address], [place] [address]                Office [phone]  Fax [phone]

## 2019-01-16 ENCOUNTER — Ambulatory Visit: Payer: Federal, State, Local not specified - PPO | Admitting: Podiatry

## 2019-01-16 ENCOUNTER — Encounter: Payer: Self-pay | Admitting: Podiatry

## 2019-01-16 ENCOUNTER — Other Ambulatory Visit: Payer: Self-pay

## 2019-01-16 DIAGNOSIS — B070 Plantar wart: Secondary | ICD-10-CM | POA: Diagnosis not present

## 2019-01-19 NOTE — Progress Notes (Signed)
   HPI: 58 y.o. male presenting today for follow up evaluation of plantar verruca of the left forefoot. He states he is doing well and has improved. He states the lesions appear to be healed. He denies modifying factors. Patient is here for further evaluation and treatment.   Past Medical History:  Diagnosis Date  . Disorder of eustachian tube 02/17/2008  . Hypertension   . Hypogonadism male 03/12/2009   04/25/10 Testosterone, Serum=214 ng/dL (Abn: L) [phone] Free Testosterone(Direct)= 3.9 pg/mL (abn: L) n=6.8-21.5    . Vitamin D deficiency      Physical Exam: General: The patient is alert and oriented x3 in no acute distress.  Dermatology: Skin is warm, dry and supple bilateral lower extremities. Negative for open lesions or macerations.  Vascular: Palpable pedal pulses bilaterally. No edema or erythema noted. Capillary refill within normal limits.  Neurological: Epicritic and protective threshold grossly intact bilaterally.   Musculoskeletal Exam: Range of motion within normal limits to all pedal and ankle joints bilateral. Muscle strength 5/5 in all groups bilateral.   Assessment: 1. Plantar verruca left forefoot    Plan of Care:  1. Patient evaluated.  2. Recommended good shoe gear.  3. May resume full activity with no restrictions.  4. Return to clinic as needed.       Edrick Kins, DPM [facility]  Dr. Edrick Kins, DPM    [address], [place] [address]                Office [phone]  Fax [phone]

## 2019-02-04 ENCOUNTER — Encounter: Payer: Self-pay | Admitting: Family Medicine

## 2019-02-04 MED ORDER — FAMOTIDINE 20 MG PO TABS: 20.0000 mg | ORAL_TABLET | Freq: Two times a day (BID) | ORAL | 4 refills | Status: DC

## 2019-02-04 MED ORDER — AMLODIPINE BESYLATE 5 MG PO TABS: ORAL_TABLET | ORAL | 4 refills | Status: DC

## 2019-02-04 MED ORDER — DOXAZOSIN MESYLATE 4 MG PO TABS: ORAL_TABLET | ORAL | 4 refills | Status: DC

## 2019-02-27 ENCOUNTER — Encounter: Payer: Self-pay | Admitting: Family Medicine

## 2019-03-27 ENCOUNTER — Encounter: Payer: Self-pay | Admitting: Family Medicine

## 2019-05-12 ENCOUNTER — Other Ambulatory Visit: Payer: Self-pay | Admitting: Family Medicine

## 2019-05-12 DIAGNOSIS — E7800 Pure hypercholesterolemia, unspecified: Secondary | ICD-10-CM

## 2019-05-12 MED ORDER — ROSUVASTATIN CALCIUM 10 MG PO TABS: 10.0000 mg | ORAL_TABLET | Freq: Every day | ORAL | 3 refills | Status: DC

## 2019-05-12 MED ORDER — AMLODIPINE BESYLATE 5 MG PO TABS: ORAL_TABLET | ORAL | 4 refills | Status: DC

## 2019-05-12 MED ORDER — DOXAZOSIN MESYLATE 4 MG PO TABS: ORAL_TABLET | ORAL | 4 refills | Status: DC

## 2019-05-12 NOTE — Telephone Encounter (Signed)
[facility] faxed refill request for the following medications:  rosuvastatin (CRESTOR) 10 MG tablet doxazosin (CARDURA) 4 MG tablet amLODipine (NORVASC) 5 MG tablet  Please advise.  Thanks, [employer]

## 2020-02-16 ENCOUNTER — Encounter: Payer: Federal, State, Local not specified - PPO | Admitting: Family Medicine

## 2020-03-22 ENCOUNTER — Other Ambulatory Visit: Payer: Self-pay

## 2020-03-22 ENCOUNTER — Other Ambulatory Visit: Payer: Federal, State, Local not specified - PPO

## 2020-03-22 DIAGNOSIS — Z20822 Contact with and (suspected) exposure to covid-19: Secondary | ICD-10-CM

## 2020-03-23 LAB — SARS-COV-2, NAA 2 DAY TAT

## 2020-03-23 LAB — NOVEL CORONAVIRUS, NAA: SARS-CoV-2, NAA: NOT DETECTED

## 2020-03-24 NOTE — Patient Instructions (Addendum)
Please review the attached list of medications and notify my office if there are any errors.    

## 2020-04-15 ENCOUNTER — Other Ambulatory Visit: Payer: Self-pay

## 2020-04-15 ENCOUNTER — Ambulatory Visit (INDEPENDENT_AMBULATORY_CARE_PROVIDER_SITE_OTHER): Payer: Federal, State, Local not specified - PPO | Admitting: Family Medicine

## 2020-04-15 ENCOUNTER — Encounter: Payer: Self-pay | Admitting: Family Medicine

## 2020-04-15 VITALS — BP 132/82 | HR 73 | Temp 97.9°F | Resp 16 | Ht 67.0 in | Wt 177.8 lb

## 2020-04-15 DIAGNOSIS — E559 Vitamin D deficiency, unspecified: Secondary | ICD-10-CM

## 2020-04-15 DIAGNOSIS — Z125 Encounter for screening for malignant neoplasm of prostate: Secondary | ICD-10-CM

## 2020-04-15 DIAGNOSIS — Z0000 Encounter for general adult medical examination without abnormal findings: Secondary | ICD-10-CM | POA: Diagnosis not present

## 2020-04-15 DIAGNOSIS — E7800 Pure hypercholesterolemia, unspecified: Secondary | ICD-10-CM

## 2020-04-15 DIAGNOSIS — I10 Essential (primary) hypertension: Secondary | ICD-10-CM | POA: Diagnosis not present

## 2020-04-15 DIAGNOSIS — E785 Hyperlipidemia, unspecified: Secondary | ICD-10-CM | POA: Diagnosis not present

## 2020-04-15 MED ORDER — AMLODIPINE BESYLATE 5 MG PO TABS: ORAL_TABLET | ORAL | 4 refills | Status: DC

## 2020-04-15 MED ORDER — ROSUVASTATIN CALCIUM 10 MG PO TABS
10.0000 mg | ORAL_TABLET | Freq: Every day | ORAL | 3 refills | Status: DC
Start: 2020-04-15 — End: 2021-01-31

## 2020-04-15 NOTE — Progress Notes (Signed)
Complete physical exam   Patient: Alex Patterson   DOB: [birth year]   60 y.o. Male  MRN: [id number] Visit Date: 04/15/2020  Today's healthcare provider: Mila Merry, MD   Chief Complaint  Patient presents with  . Annual Exam  . Hyperlipidemia  . Hypertension   Subjective    Alex Patterson is a 60 y.o. male who presents today for a complete physical exam.  He reports consuming a general diet. Home exercise routine includes running and weight lifting 3 days per week. He generally feels fairly well. He reports sleeping fairly well. He does have additional problems to discuss today (occasional lower left side back pain).  HPI  Lipid/Cholesterol, Follow-up  Last lipid panel Other pertinent labs  Lab Results  Component Value Date   CHOL 161 04/23/2018   HDL 60 04/23/2018   LDLCALC 92 04/23/2018   TRIG 47 04/23/2018   CHOLHDL 2.7 04/23/2018   Lab Results  Component Value Date   ALT 22 09/03/2018   AST 22 09/03/2018   PLT 168 09/03/2018   TSH 2.900 06/04/2017     He was last seen for this 11/04/2018. Management since that visit includes continue same medications.  He reports good compliance with treatment. He is not having side effects.   Symptoms: No chest pain No chest pressure/discomfort  No dyspnea No lower extremity edema  No numbness or tingling of extremity No orthopnea  No palpitations No paroxysmal nocturnal dyspnea  No speech difficulty No syncope   Current diet: in general, a "healthy" diet   Current exercise: running/ jogging and weightlifting  The ASCVD Risk score Denman George DC Jr., et al., 2013) failed to calculate for the following reasons:   The patient has a prior MI or stroke diagnosis  ---------------------------------------------------------------------------------------------------  Hypertension, follow-up  BP Readings from Last 3 Encounters:  04/15/20 132/82  11/04/18 140/80  10/09/18 136/77   Wt Readings from Last 3 Encounters:   04/15/20 177 lb 12.8 oz (80.6 kg)  11/04/18 178 lb (80.7 kg)  09/03/18 185 lb (83.9 kg)     He was last seen for hypertension 11/04/2018.   BP at that visit was 140/80. Management since that visit includes continue same medications.  He reports fair compliance with treatment. He has been out of Clonidine and Cardura for the past 6 months to a year.  States he had been exercising regularly, eating less, and [place] weight [place]. He has gained some weight back since returning to [place] earlier this winter, but home blood pressure remains consistently below 140.  He is not having side effects.  He is following a Regular diet. He is exercising. He does not smoke.  Use of agents associated with hypertension: NSAIDS.   Outside blood pressures are are averaging 129/70. Symptoms: No chest pain No chest pressure  No palpitations No syncope  No dyspnea No orthopnea  No paroxysmal nocturnal dyspnea No lower extremity edema   Pertinent labs: Lab Results  Component Value Date   CHOL 161 04/23/2018   HDL 60 04/23/2018   LDLCALC 92 04/23/2018   TRIG 47 04/23/2018   CHOLHDL 2.7 04/23/2018   Lab Results  Component Value Date   NA 143 09/03/2018   K 4.1 09/03/2018   CREATININE 0.92 09/03/2018   GFRNONAA 93 09/03/2018   GFRAA 108 09/03/2018   GLUCOSE 101 (H) 09/03/2018     The ASCVD Risk score Denman George DC Jr., et al., 2013) failed to calculate for the following  reasons:   The patient has a prior MI or stroke diagnosis   ---------------------------------------------------------------------------------------------------  Past Medical History:  Diagnosis Date  . Disorder of eustachian tube 02/17/2008  . Hypertension   . Hypogonadism male 03/12/2009   04/25/10 Testosterone, Serum=214 ng/dL (Abn: L) [phone] Free Testosterone(Direct)= 3.9 pg/mL (abn: L) n=6.8-21.5    . Vitamin D deficiency    Past Surgical History:  Procedure Laterality Date  . APPENDECTOMY  2001  . NASAL  SINUS SURGERY  2003  . undescended left testicle  1973   Social History   Socioeconomic History  . Marital status: Married    Spouse name: Not on file  . Number of children: 2  . Years of education: Coll Grad  . Highest education level: Not on file  Occupational History  . Occupation: Audiological scientist  Tobacco Use  . Smoking status: Never Smoker  . Smokeless tobacco: Never Used  Vaping Use  . Vaping Use: Never used  Substance and Sexual Activity  . Alcohol use: No  . Drug use: No  . Sexual activity: Yes  Other Topics Concern  . Not on file  Social History Narrative  . Not on file   Social Determinants of Health   Financial Resource Strain: Not on file  Food Insecurity: Not on file  Transportation Needs: Not on file  Physical Activity: Not on file  Stress: Not on file  Social Connections: Not on file  Intimate Partner Violence: Not on file   Family Status  Relation Name Status  . Mother  Deceased  . Father  Deceased  . Sister  Alive   Family History  Problem Relation Age of Onset  . Lung cancer Mother   . Stroke Father    No Known Allergies  Patient Care Team: Malva Limes, MD as PCP - General (Family Medicine)   Medications: Outpatient Medications Prior to Visit  Medication Sig  . amLODipine (NORVASC) 5 MG tablet TAKE 1 TABLET(5 MG) BY MOUTH DAILY  . aspirin EC 81 MG tablet Take 81 mg by mouth daily.  . famotidine (PEPCID) 20 MG tablet Take 1 tablet (20 mg total) by mouth 2 (two) times daily.  . fluticasone (FLONASE) 50 MCG/ACT nasal spray Place 2 sprays into the nose daily.  . rosuvastatin (CRESTOR) 10 MG tablet Take 1 tablet (10 mg total) by mouth daily.  . cloNIDine (CATAPRES) 0.1 MG tablet Take one tablet as needed for SBP>160 or DBP>100. May repeat in 30 minutes if needed. Do not exceed 6 tablets in a day. (Patient not taking: Reported on 04/15/2020)  . doxazosin (CARDURA) 4 MG tablet TAKE 1 TABLET(4 MG) BY MOUTH DAILY IN THE EVENING (Patient not  taking: Reported on 04/15/2020)   No facility-administered medications prior to visit.    Review of Systems  Constitutional: Negative for appetite change, chills, fatigue and fever.  HENT: Negative for congestion, ear pain, hearing loss, nosebleeds and trouble swallowing.   Eyes: Negative for pain and visual disturbance.  Respiratory: Negative for cough, chest tightness and shortness of breath.   Cardiovascular: Negative for chest pain, palpitations and leg swelling.  Gastrointestinal: Negative for abdominal pain, blood in stool, constipation, diarrhea, nausea and vomiting.  Endocrine: Negative for polydipsia, polyphagia and polyuria.  Genitourinary: Negative for dysuria and flank pain.  Musculoskeletal: Positive for back pain. Negative for arthralgias, joint swelling, myalgias and neck stiffness.  Skin: Negative for color change, rash and wound.  Neurological: Negative for dizziness, tremors, seizures, speech difficulty, weakness, light-headedness and headaches.  Psychiatric/Behavioral: Negative for behavioral problems, confusion, decreased concentration, dysphoric mood and sleep disturbance. The patient is not nervous/anxious.   All other systems reviewed and are negative.     Objective    BP 132/82 (BP Location: Left Arm, Patient Position: Sitting, Cuff Size: Large)   Pulse 73   Temp 97.9 F (36.6 C) (Temporal)   Resp 16   Ht 5\' 7"  (1.702 m)   Wt 177 lb 12.8 oz (80.6 kg)   BMI 27.85 kg/m    Physical Exam   General Appearance:     Well developed, well nourished male. Alert, cooperative, in no acute distress, appears stated age  Head:    Normocephalic, without obvious abnormality, atraumatic  Eyes:    PERRL, conjunctiva/corneas clear, EOM's intact, fundi    benign, both eyes       Ears:    Normal TM's and external ear canals, both ears  Neck:   Supple, symmetrical, trachea midline, no adenopathy;       thyroid:  No enlargement/tenderness/nodules; no carotid   bruit or JVD   Back:     Symmetric, no curvature, ROM normal, no CVA tenderness  Lungs:     Clear to auscultation bilaterally, respirations unlabored  Chest wall:    No tenderness or deformity  Heart:    Normal heart rate. Normal rhythm. No murmurs, rubs, or gallops.  S1 and S2 normal  Abdomen:     Soft, non-tender, bowel sounds active all four quadrants,    no masses, no organomegaly  Genitalia:    deferred  Rectal:    deferred  Extremities:   All extremities are intact. No cyanosis or edema  Pulses:   2+ and symmetric all extremities  Skin:   Skin color, texture, turgor normal, no rashes or lesions  Neurologic:   CNII-XII intact. Normal strength, sensation and reflexes      throughout     Last depression screening scores PHQ 2/9 Scores 04/15/2020 09/03/2018 04/23/2018  PHQ - 2 Score 0 0 1  PHQ- 9 Score 0 0 2   Last fall risk screening Fall Risk  04/15/2020  Falls in the past year? 0  Number falls in past yr: 0  Injury with Fall? 0  Follow up Falls evaluation completed   Last Audit-C alcohol use screening Alcohol Use Disorder Test (AUDIT) 04/15/2020  1. How often do you have a drink containing alcohol? 0  2. How many drinks containing alcohol do you have on a typical day when you are drinking? 0  3. How often do you have six or more drinks on one occasion? 0  AUDIT-C Score 0  Alcohol Brief Interventions/Follow-up AUDIT Score <7 follow-up not indicated   A score of 3 or more in women, and 4 or more in men indicates increased risk for alcohol abuse, EXCEPT if all of the points are from question 1   No results found for any visits on 04/15/20.  Assessment & Plan    Routine Health Maintenance and Physical Exam  Exercise Activities and Dietary recommendations Goals   None     Immunization History  Administered Date(s) Administered  . Hepatitis A, Adult 03/14/2018, 10/15/2018  . Hepatitis B, adult 03/14/2018, 04/23/2018  . Influenza,inj,Quad PF,6+ Mos 12/25/2019  . Td 04/12/1996  . Tdap  03/19/2011  . Zoster Recombinat (Shingrix) 03/13/2017, 06/06/2017    Health Maintenance  Topic Date Due  . COVID-19 Vaccine (1) Never done  . Fecal DNA (Cologuard)  05/30/2020  . TETANUS/TDAP  03/18/2021  .  INFLUENZA VACCINE  Completed  . Hepatitis C Screening  Completed  . HIV Screening  Completed    Discussed health benefits of physical activity, and encouraged him to engage in regular exercise appropriate for his age and condition.  1. Annual physical exam Has already had flu and Covid vaccines.   2. Paroxysmal hypertension Off doxazosin and clonidine since losing weight with overseas assignment. He will continue- amLODipine (NORVASC) 5 MG tablet; TAKE 1 TABLET(5 MG) BY MOUTH DAILY  Dispense: 90 tablet; Refill: 4  He does have a supply of doxazosin in case his BP starts running high again. He anticipates returning to [place] to work for the [place] [employer] office later this month and expects assignment to last a year.   3. Pure hypercholesterolemia  - rosuvastatin (CRESTOR) 10 MG tablet; Take 1 tablet (10 mg total) by mouth daily.  Dispense: 90 tablet; Refill: 3 - CBC - Comprehensive metabolic panel - TSH  4. Vitamin D deficiency  - VITAMIN D 25 Hydroxy (Vit-D Deficiency, Fractures)  5. Prostate cancer screening  - PSA   No follow-ups on file.     The entirety of the information documented in the History of Present Illness, Review of Systems and Physical Exam were personally obtained by me. Portions of this information were initially documented by the CMA and reviewed by me for thoroughness and accuracy.      Mila Merry, MD  [facility] [phone] (phone) [phone] (fax)  [facility]

## 2020-04-16 LAB — CBC
Hematocrit: 43.7 % (ref 37.5–51.0)
Hemoglobin: 14.7 g/dL (ref 13.0–17.7)
MCH: 28.7 pg (ref 26.6–33.0)
MCHC: 33.6 g/dL (ref 31.5–35.7)
MCV: 85 fL (ref 79–97)
Platelets: 182 10*3/uL (ref 150–450)
RBC: 5.13 x10E6/uL (ref 4.14–5.80)
RDW: 13 % (ref 11.6–15.4)
WBC: 4.6 10*3/uL (ref 3.4–10.8)

## 2020-04-16 LAB — VITAMIN D 25 HYDROXY (VIT D DEFICIENCY, FRACTURES): Vit D, 25-Hydroxy: 47.8 ng/mL (ref 30.0–100.0)

## 2020-04-16 LAB — COMPREHENSIVE METABOLIC PANEL
ALT: 33 IU/L (ref 0–44)
AST: 36 IU/L (ref 0–40)
Albumin/Globulin Ratio: 2.4 — ABNORMAL HIGH (ref 1.2–2.2)
Albumin: 4.6 g/dL (ref 3.8–4.9)
Alkaline Phosphatase: 82 IU/L (ref 44–121)
BUN/Creatinine Ratio: 15 (ref 9–20)
BUN: 18 mg/dL (ref 6–24)
Bilirubin Total: 0.9 mg/dL (ref 0.0–1.2)
CO2: 23 mmol/L (ref 20–29)
Calcium: 9.5 mg/dL (ref 8.7–10.2)
Chloride: 105 mmol/L (ref 96–106)
Creatinine, Ser: 1.2 mg/dL (ref 0.76–1.27)
GFR calc Af Amer: 77 mL/min/{1.73_m2} (ref >59)
GFR calc non Af Amer: 67 mL/min/{1.73_m2} (ref >59)
Globulin, Total: 1.9 g/dL (ref 1.5–4.5)
Glucose: 99 mg/dL (ref 65–99)
Potassium: 4.6 mmol/L (ref 3.5–5.2)
Sodium: 142 mmol/L (ref 134–144)
Total Protein: 6.5 g/dL (ref 6.0–8.5)

## 2020-04-16 LAB — PSA: Prostate Specific Ag, Serum: 1.1 ng/mL (ref 0.0–4.0)

## 2020-04-16 LAB — TSH: TSH: 1.91 u[IU]/mL (ref 0.450–4.500)

## 2020-04-20 ENCOUNTER — Other Ambulatory Visit: Payer: Self-pay | Admitting: Family Medicine

## 2020-04-28 ENCOUNTER — Encounter: Payer: Self-pay | Admitting: Family Medicine

## 2020-05-10 LAB — LIPID PANEL W/O CHOL/HDL RATIO
Cholesterol, Total: 161 mg/dL (ref 100–199)
HDL: 68 mg/dL (ref >39)
LDL Chol Calc (NIH): 80 mg/dL (ref 0–99)
Triglycerides: 65 mg/dL (ref 0–149)
VLDL Cholesterol Cal: 13 mg/dL (ref 5–40)

## 2020-05-10 LAB — SPECIMEN STATUS REPORT

## 2021-01-30 ENCOUNTER — Other Ambulatory Visit: Payer: Self-pay | Admitting: Family Medicine

## 2021-01-30 DIAGNOSIS — I10 Essential (primary) hypertension: Secondary | ICD-10-CM

## 2021-01-30 DIAGNOSIS — E7800 Pure hypercholesterolemia, unspecified: Secondary | ICD-10-CM

## 2021-01-31 NOTE — Telephone Encounter (Signed)
Requested Prescriptions  Pending Prescriptions Disp Refills  . amLODipine (NORVASC) 5 MG tablet [Pharmacy Med Name: AMLODIPINE TAB 5MG ] 90 tablet 0    Sig: TAKE 1 TABLET DAILY     Cardiovascular:  Calcium Channel Blockers Failed - 01/30/2021 10:28 PM      Failed - Valid encounter within last 6 months    Recent Outpatient Visits          9 months ago Annual physical exam   [facility], MD   2 years ago Pure hypercholesterolemia   [facility], MD   2 years ago Need for hepatitis A vaccination   [facility], MD   2 years ago Otalgia, left ear   [facility] Chrismon, [facility], PA-C   2 years ago Annual physical exam   [facility], MD             Passed - Last BP in normal range    BP Readings from Last 1 Encounters:  04/15/20 132/82         . rosuvastatin (CRESTOR) 10 MG tablet [Pharmacy Med Name: ROSUVASTATIN TAB 10MG ] 90 tablet 0    Sig: TAKE 1 TABLET DAILY     Cardiovascular:  Antilipid - Statins Passed - 01/30/2021 10:28 PM      Passed - Total Cholesterol in normal range and within 360 days    Cholesterol, Total  Date Value Ref Range Status  04/15/2020 161 100 - 199 mg/dL Final         Passed - LDL in normal range and within 360 days    LDL Chol Calc (NIH)  Date Value Ref Range Status  04/15/2020 80 0 - 99 mg/dL Final         Passed - HDL in normal range and within 360 days    HDL  Date Value Ref Range Status  04/15/2020 68 >39 mg/dL Final         Passed - Triglycerides in normal range and within 360 days    Triglycerides  Date Value Ref Range Status  04/15/2020 65 0 - 149 mg/dL Final         Passed - Patient is not pregnant      Passed - Valid encounter within last 12 months    Recent Outpatient Visits          9 months ago Annual physical exam   [facility] 06/13/2020, MD   2 years ago  Pure hypercholesterolemia   [facility] Malva Limes, MD   2 years ago Need for hepatitis A vaccination   [facility] Malva Limes, MD   2 years ago Otalgia, left ear   [facility] Chrismon, Malva Limes, PA-C   2 years ago Annual physical exam   [facility] Jodell Cipro, MD             \

## 2021-05-12 ENCOUNTER — Encounter: Payer: Self-pay | Admitting: Family Medicine

## 2021-08-02 ENCOUNTER — Encounter: Payer: Self-pay | Admitting: Family Medicine

## 2021-08-02 ENCOUNTER — Ambulatory Visit (INDEPENDENT_AMBULATORY_CARE_PROVIDER_SITE_OTHER): Payer: Federal, State, Local not specified - PPO | Admitting: Family Medicine

## 2021-08-02 VITALS — BP 134/96 | HR 91 | Temp 98.9°F | Resp 16 | Ht 67.0 in | Wt 192.4 lb

## 2021-08-02 DIAGNOSIS — E559 Vitamin D deficiency, unspecified: Secondary | ICD-10-CM

## 2021-08-02 DIAGNOSIS — Z0000 Encounter for general adult medical examination without abnormal findings: Secondary | ICD-10-CM | POA: Diagnosis not present

## 2021-08-02 DIAGNOSIS — Z23 Encounter for immunization: Secondary | ICD-10-CM | POA: Diagnosis not present

## 2021-08-02 DIAGNOSIS — Z125 Encounter for screening for malignant neoplasm of prostate: Secondary | ICD-10-CM | POA: Diagnosis not present

## 2021-08-02 DIAGNOSIS — I10 Essential (primary) hypertension: Secondary | ICD-10-CM

## 2021-08-02 NOTE — Progress Notes (Signed)
I,Tiffany J Bragg,acting as a scribe for Mila Merryonald Cherylin Waguespack, MD.,have documented all relevant documentation on the behalf of Mila Merryonald Kimbra Marcelino, MD,as directed by  Mila Merryonald Ambrosia Wisnewski, MD while in the presence of Mila Merryonald Isadora Delorey, MD.   Complete physical exam   Patient: Alex Patterson   DOB: 28-Oct-1960   61 y.o. Male  MRN: [id number] Visit Date: 08/02/2021  Today's healthcare provider: Mila Merryonald Colbie Sliker, MD   Chief Complaint  Patient presents with   Annual Exam   Hypertension   Hyperlipidemia   Subjective    Alex Bathnand P Dewan is a 61 y.o. male who presents today for a complete physical exam.  He reports consuming a general diet. Gym/ health club routine includes cardio and mod to heavy weightlifting. He generally feels well. He reports sleeping fairly well. He does have additional problems to discuss today. Patient states he has heel pain after running. HPI  Hypertension, follow-up  BP Readings from Last 3 Encounters:  08/02/21 (!) 134/96  04/15/20 132/82  11/04/18 140/80   Wt Readings from Last 3 Encounters:  08/02/21 192 lb 6.4 oz (87.3 kg)  04/15/20 177 lb 12.8 oz (80.6 kg)  11/04/18 178 lb (80.7 kg)     He was last seen for hypertension 1  year  ago.  BP at that visit was 132/82. Management since that visit includes continuing- amLODipine (NORVASC) 5 MG tablet; TAKE 1 TABLET(5 MG) BY MOUTH DAILY .  He reports excellent compliance with treatment. Since then he has stopped doxazosin and not required any doses of clonidine. He exercises regularly.  He is not having side effects.  He is following a Regular diet. He is exercising. He does not smoke.  Use of agents associated with hypertension: NSAIDS.   Outside blood pressures are not checked. Symptoms: No chest pain No chest pressure  No palpitations No syncope  No dyspnea No orthopnea  No paroxysmal nocturnal dyspnea No lower extremity edema   Pertinent labs Lab Results  Component Value Date   NA 142 04/15/2020   K 4.6  04/15/2020   CREATININE 1.20 04/15/2020   GFRNONAA 67 04/15/2020   GLUCOSE 99 04/15/2020   TSH 1.910 04/15/2020     The 10-year ASCVD risk score (Arnett DK, et al., 2019) is: 5.8%  ---------------------------------------------------------------------------------------------------   Lipid/Cholesterol, Follow-up  Last lipid panel Other pertinent labs  Lab Results  Component Value Date   CHOL 161 04/15/2020   HDL 68 04/15/2020   LDLCALC 80 04/15/2020   TRIG 65 04/15/2020   CHOLHDL 2.7 04/23/2018   Lab Results  Component Value Date   ALT 33 04/15/2020   AST 36 04/15/2020   PLT 182 04/15/2020   TSH 1.910 04/15/2020     He was last seen for this 1  year  ago.  Management since that visit includes continuing same treatment.  He reports excellent compliance with treatment. He is not having side effects.   Symptoms: No chest pain No chest pressure/discomfort  No dyspnea No lower extremity edema  No numbness or tingling of extremity No orthopnea  No palpitations No paroxysmal nocturnal dyspnea  No speech difficulty No syncope   Current diet: in general, a "healthy" diet   Current exercise: cardiovascular workout on exercise equipment  The 10-year ASCVD risk score (Arnett DK, et al., 2019) is: 5.8%  ---------------------------------------------------------------------------------------------------   Follow up for vitamin D deficiency:  The patient was last seen for this 1  year  ago. Changes made at last visit include none.  He reports  excellent compliance with treatment. He feels that condition is Unchanged. He is not having side effects.  Lab Results  Component Value Date   VD25OH 47.8 04/15/2020    -----------------------------------------------------------------------------------------   Past Medical History:  Diagnosis Date   Demand ischemia of myocardium (HCC) 05/15/2017   Secondary to accelerated hypertension   Disorder of eustachian tube 02/17/2008    Hypogonadism male 03/12/2009   04/25/10 Testosterone, Serum=214 ng/dL (Abn: L) [id number] Free Testosterone(Direct)= 3.9 pg/mL (abn: L) n=6.8-21.5     Vitamin D deficiency    Past Surgical History:  Procedure Laterality Date   APPENDECTOMY  2001   NASAL SINUS SURGERY  2003   undescended left testicle  1973   Social History   Socioeconomic History   Marital status: Married    Spouse name: Not on file   Number of children: 2   Years of education: Coll Grad   Highest education level: Not on file  Occupational History   Occupation: Audiological scientist  Tobacco Use   Smoking status: Never   Smokeless tobacco: Never  Vaping Use   Vaping Use: Never used  Substance and Sexual Activity   Alcohol use: No   Drug use: No   Sexual activity: Yes  Other Topics Concern   Not on file  Social History Narrative   Not on file   Social Determinants of Health   Financial Resource Strain: Not on file  Food Insecurity: Not on file  Transportation Needs: Not on file  Physical Activity: Not on file  Stress: Not on file  Social Connections: Not on file  Intimate Partner Violence: Not on file   Family Status  Relation Name Status   Mother  Deceased   Father  Deceased   Sister  Alive   Family History  Problem Relation Age of Onset   Lung cancer Mother    Stroke Father    No Known Allergies  Patient Care Team: Malva Limes, MD as PCP - General (Family Medicine)   Medications: Outpatient Medications Prior to Visit  Medication Sig   amLODipine (NORVASC) 5 MG tablet TAKE 1 TABLET DAILY   fluticasone (FLONASE) 50 MCG/ACT nasal spray Place 2 sprays into the nose daily.   rosuvastatin (CRESTOR) 10 MG tablet TAKE 1 TABLET DAILY   aspirin EC 81 MG tablet Take 81 mg by mouth daily. (Patient not taking: Reported on 08/02/2021)   cloNIDine (CATAPRES) 0.1 MG tablet Take one tablet as needed for SBP>160 or DBP>100. May repeat in 30 minutes if needed. Do not exceed 6 tablets in a day. (Patient not  taking: Reported on 04/15/2020)   doxazosin (CARDURA) 4 MG tablet TAKE 1 TABLET(4 MG) BY MOUTH DAILY IN THE EVENING (Patient not taking: Reported on 04/15/2020)   famotidine (PEPCID) 20 MG tablet Take 1 tablet (20 mg total) by mouth 2 (two) times daily. (Patient not taking: Reported on 08/02/2021)   No facility-administered medications prior to visit.    Review of Systems  Constitutional:  Negative for appetite change, chills, fatigue and fever.  HENT:  Negative for congestion, ear pain, hearing loss, nosebleeds and trouble swallowing.   Eyes:  Negative for pain and visual disturbance.  Respiratory:  Negative for cough, chest tightness and shortness of breath.   Cardiovascular:  Negative for chest pain, palpitations and leg swelling.  Gastrointestinal:  Negative for abdominal pain, blood in stool, constipation, diarrhea, nausea and vomiting.  Endocrine: Negative for polydipsia, polyphagia and polyuria.  Genitourinary:  Negative for dysuria and flank pain.  Musculoskeletal:  Negative for arthralgias, back pain, joint swelling, myalgias and neck stiffness.  Skin:  Negative for color change, rash and wound.  Neurological:  Negative for dizziness, tremors, seizures, speech difficulty, weakness, light-headedness and headaches.  Psychiatric/Behavioral:  Negative for behavioral problems, confusion, decreased concentration, dysphoric mood and sleep disturbance. The patient is not nervous/anxious.   All other systems reviewed and are negative.    Objective     BP (!) 134/96 (BP Location: Right Arm, Patient Position: Sitting, Cuff Size: Normal)   Pulse 91   Temp 98.9 F (37.2 C) (Oral)   Resp 16   Ht 5\' 7"  (1.702 m)   Wt 192 lb 6.4 oz (87.3 kg)   SpO2 98%   BMI 30.13 kg/m     Physical Exam   General Appearance:    Overweight male. Alert, cooperative, in no acute distress, appears stated age  Head:    Normocephalic, without obvious abnormality, atraumatic  Eyes:    PERRL,  conjunctiva/corneas clear, EOM's intact, fundi    benign, both eyes       Ears:    Normal TM's and external ear canals, both ears  Neck:   Supple, symmetrical, trachea midline, no adenopathy;       thyroid:  No enlargement/tenderness/nodules; no carotid   bruit or JVD  Back:     Symmetric, no curvature, ROM normal, no CVA tenderness  Lungs:     Clear to auscultation bilaterally, respirations unlabored  Chest wall:    No tenderness or deformity  Heart:    Normal heart rate. Normal rhythm. No murmurs, rubs, or gallops.  S1 and S2 normal  Abdomen:     Soft, non-tender, bowel sounds active all four quadrants,    no masses, no organomegaly  Genitalia:    deferred  Rectal:    deferred  Extremities:   All extremities are intact. No cyanosis or edema  Skin:   Skin color, texture, turgor normal, no rashes or lesions  Lymph nodes:   Cervical nodes normal  Neurologic:   CNII-XII intact. Normal strength, sensation and reflexes      throughout     Last depression screening scores    04/15/2020   10:07 AM 09/03/2018    9:02 AM 04/23/2018    1:30 PM  PHQ 2/9 Scores  PHQ - 2 Score 0 0 1  PHQ- 9 Score 0 0 2   Last fall risk screening    04/15/2020   10:08 AM  Fall Risk   Falls in the past year? 0  Number falls in past yr: 0  Injury with Fall? 0  Follow up Falls evaluation completed   Last Audit-C alcohol use screening    04/15/2020   10:08 AM  Alcohol Use Disorder Test (AUDIT)  1. How often do you have a drink containing alcohol? 0  2. How many drinks containing alcohol do you have on a typical day when you are drinking? 0  3. How often do you have six or more drinks on one occasion? 0  AUDIT-C Score 0  Alcohol Brief Interventions/Follow-up AUDIT Score <7 follow-up not indicated   A score of 3 or more in women, and 4 or more in men indicates increased risk for alcohol abuse, EXCEPT if all of the points are from question 1   No results found for any visits on 08/02/21.  Assessment & Plan     Routine Health Maintenance and Physical Exam  Exercise Activities and Dietary recommendations  Goals  None     Immunization History  Administered Date(s) Administered   Hepatitis A, Adult 03/14/2018, 10/15/2018   Hepatitis B, adult 03/14/2018, 04/23/2018   Influenza,inj,Quad PF,6+ Mos 12/25/2019   Td 04/12/1996   Tdap 03/19/2011   Zoster Recombinat (Shingrix) 03/13/2017, 06/06/2017    Health Maintenance  Topic Date Due   COVID-19 Vaccine (1) Never done   Fecal DNA (Cologuard)  05/30/2020   TETANUS/TDAP  03/18/2021   INFLUENZA VACCINE  10/10/2021   Hepatitis C Screening  Completed   HIV Screening  Completed   Zoster Vaccines- Shingrix  Completed   HPV VACCINES  Aged Out    Discussed health benefits of physical activity, and encouraged him to engage in regular exercise appropriate for his age and condition.  1. Annual physical exam  - EKG 12-Lead - CBC - Comprehensive metabolic panel - Lipid panel  2. Need for Tdap vaccination  - Tdap vaccine greater than or equal to 7yo IM  3. Prostate cancer screening  - PSA Total (Reflex To Free)  4. Vitamin D deficiency  - VITAMIN D 25 Hydroxy (Vit-D Deficiency, Fractures)  5. Paroxysmal hypertension Doing well with amlodipine monotherapy.   - EKG 12-Lead - CBC - Comprehensive metabolic panel - Lipid panel     The entirety of the information documented in the History of Present Illness, Review of Systems and Physical Exam were personally obtained by me. Portions of this information were initially documented by the CMA and reviewed by me for thoroughness and accuracy.     Mila Merry, MD  [facility] [phone] (phone) [phone] (fax)  [facility]

## 2021-08-02 NOTE — Patient Instructions (Addendum)
.   Please review the attached list of medications and notify my office if there are any errors.   . Please bring all of your medications to every appointment so we can make sure that our medication list is the same as yours.   . Please go to the lab draw station in [address] on the second floor of [facility]  when you are fasting for 8 hours. Normal hours are 8:00am to 11:30am and 1:00pm to 4:00pm Monday through Friday   

## 2021-08-03 LAB — COMPREHENSIVE METABOLIC PANEL
ALT: 36 IU/L (ref 0–44)
AST: 34 IU/L (ref 0–40)
Albumin/Globulin Ratio: 2.4 — ABNORMAL HIGH (ref 1.2–2.2)
Albumin: 4.5 g/dL (ref 3.8–4.9)
Alkaline Phosphatase: 77 IU/L (ref 44–121)
BUN/Creatinine Ratio: 13 (ref 9–20)
BUN: 14 mg/dL (ref 6–24)
Bilirubin Total: 0.8 mg/dL (ref 0.0–1.2)
CO2: 23 mmol/L (ref 20–29)
Calcium: 9.2 mg/dL (ref 8.7–10.2)
Chloride: 104 mmol/L (ref 96–106)
Creatinine, Ser: 1.09 mg/dL (ref 0.76–1.27)
Globulin, Total: 1.9 g/dL (ref 1.5–4.5)
Glucose: 93 mg/dL (ref 70–99)
Potassium: 4 mmol/L (ref 3.5–5.2)
Sodium: 141 mmol/L (ref 134–144)
Total Protein: 6.4 g/dL (ref 6.0–8.5)
eGFR: 79 mL/min/{1.73_m2} (ref >59)

## 2021-08-03 LAB — CBC
Hematocrit: 44 % (ref 37.5–51.0)
Hemoglobin: 15.4 g/dL (ref 13.0–17.7)
MCH: 29.2 pg (ref 26.6–33.0)
MCHC: 35 g/dL (ref 31.5–35.7)
MCV: 84 fL (ref 79–97)
Platelets: 186 10*3/uL (ref 150–450)
RBC: 5.27 x10E6/uL (ref 4.14–5.80)
RDW: 12.7 % (ref 11.6–15.4)
WBC: 6.4 10*3/uL (ref 3.4–10.8)

## 2021-08-03 LAB — LIPID PANEL
Chol/HDL Ratio: 2.4 ratio (ref 0.0–5.0)
Cholesterol, Total: 161 mg/dL (ref 100–199)
HDL: 67 mg/dL (ref >39)
LDL Chol Calc (NIH): 84 mg/dL (ref 0–99)
Triglycerides: 45 mg/dL (ref 0–149)
VLDL Cholesterol Cal: 10 mg/dL (ref 5–40)

## 2021-08-03 LAB — PSA TOTAL (REFLEX TO FREE): Prostate Specific Ag, Serum: 1.2 ng/mL (ref 0.0–4.0)

## 2021-08-03 LAB — VITAMIN D 25 HYDROXY (VIT D DEFICIENCY, FRACTURES): Vit D, 25-Hydroxy: 75.7 ng/mL (ref 30.0–100.0)

## 2021-08-04 ENCOUNTER — Encounter: Payer: Self-pay | Admitting: Family Medicine

## 2021-08-11 LAB — COLOGUARD: COLOGUARD: NEGATIVE

## 2021-09-24 ENCOUNTER — Other Ambulatory Visit: Payer: Self-pay | Admitting: Family Medicine

## 2021-09-24 DIAGNOSIS — I10 Essential (primary) hypertension: Secondary | ICD-10-CM

## 2021-09-24 DIAGNOSIS — E7800 Pure hypercholesterolemia, unspecified: Secondary | ICD-10-CM

## 2021-09-24 MED ORDER — AMLODIPINE BESYLATE 5 MG PO TABS: ORAL_TABLET | ORAL | 3 refills | Status: DC

## 2021-09-24 MED ORDER — ROSUVASTATIN CALCIUM 10 MG PO TABS: 10.0000 mg | ORAL_TABLET | Freq: Every day | ORAL | 3 refills | Status: DC

## 2022-05-19 ENCOUNTER — Other Ambulatory Visit: Payer: Self-pay | Admitting: Family Medicine

## 2022-05-19 DIAGNOSIS — I10 Essential (primary) hypertension: Secondary | ICD-10-CM

## 2022-05-21 NOTE — Telephone Encounter (Signed)
Requested medication (s) are due for refill today: yes   Requested medication (s) are on the active medication list: yes   Last refill:  09/24/21 #90 3 refills  Future visit scheduled: no   Notes to clinic:  last OV 07/13/21. Called patient to schedule appt for medication refills. No answer, LVMTCB. Do you want to give courtesy refill for #30 or #90 days?     Requested Prescriptions  Pending Prescriptions Disp Refills   amLODipine (NORVASC) 5 MG tablet [Pharmacy Med Name: AMLODIPINE TAB '5MG'$ ] 90 tablet 3    Sig: TAKE 1 TABLET DAILY     Cardiovascular: Calcium Channel Blockers 2 Failed - 05/19/2022 10:46 PM      Failed - Last BP in normal range    BP Readings from Last 1 Encounters:  08/02/21 (!) 134/96         Failed - Valid encounter within last 6 months    Recent Outpatient Visits           9 months ago Annual physical exam   [facility] Birdie Sons, MD   2 years ago Annual physical exam   [facility] Birdie Sons, MD   3 years ago Pure hypercholesterolemia   [place], Donald E, MD   3 years ago Need for hepatitis A vaccination   [place] Donald E, [place]   3 years ago Otalgia, left ear   [place] [place]              Passed - Last Heart Rate in normal range    Pulse Readings from Last 1 Encounters:  08/02/21 91

## 2022-05-21 NOTE — Telephone Encounter (Signed)
Called patient to schedule appt for medication refills. No answer, LVMTCB. 

## 2022-07-30 ENCOUNTER — Other Ambulatory Visit: Payer: Self-pay | Admitting: Family Medicine

## 2022-07-30 DIAGNOSIS — E7800 Pure hypercholesterolemia, unspecified: Secondary | ICD-10-CM

## 2022-07-31 NOTE — Telephone Encounter (Signed)
Requested Prescriptions  Refused Prescriptions Disp Refills   rosuvastatin (CRESTOR) 10 MG tablet [Pharmacy Med Name: ROSUVASTATIN TAB 10MG ] 90 tablet 3    Sig: TAKE 1 TABLET DAILY     Cardiovascular:  Antilipid - Statins 2 Failed - 07/30/2022  7:26 PM      Failed - Cr in normal range and within 360 days    Creatinine, Ser  Date Value Ref Range Status  08/02/2021 1.09 0.76 - 1.27 mg/dL Final         Failed - Valid encounter within last 12 months    Recent Outpatient Visits           12 months ago Annual physical exam   [place] [facility] Malva Limes, MD   2 years ago Annual physical exam   [facility] Malva Limes, MD   3 years ago Pure hypercholesterolemia   [place] [facility] Malva Limes, MD   3 years ago Need for hepatitis A vaccination   [facility] Malva Limes, MD   3 years ago Otalgia, left ear   [place] [facility] Chrismon, Jodell Cipro, PA-C              Failed - Lipid Panel in normal range within the last 12 months    Cholesterol, Total  Date Value Ref Range Status  08/02/2021 161 100 - 199 mg/dL Final   LDL Chol Calc (NIH)  Date Value Ref Range Status  08/02/2021 84 0 - 99 mg/dL Final   HDL  Date Value Ref Range Status  08/02/2021 67 >39 mg/dL Final   Triglycerides  Date Value Ref Range Status  08/02/2021 45 0 - 149 mg/dL Final         Passed - Patient is not pregnant

## 2023-03-23 ENCOUNTER — Other Ambulatory Visit: Payer: Self-pay | Admitting: Family Medicine

## 2023-03-23 DIAGNOSIS — I10 Essential (primary) hypertension: Secondary | ICD-10-CM

## 2023-06-07 ENCOUNTER — Other Ambulatory Visit: Payer: Self-pay | Admitting: Family Medicine

## 2023-06-07 DIAGNOSIS — I10 Essential (primary) hypertension: Secondary | ICD-10-CM

## 2023-06-10 NOTE — Telephone Encounter (Signed)
 Patient needs an appointment. Last office visit was 2023

## 2023-09-19 DIAGNOSIS — H40023 Open angle with borderline findings, high risk, bilateral: Secondary | ICD-10-CM | POA: Diagnosis not present

## 2023-09-19 DIAGNOSIS — H2513 Age-related nuclear cataract, bilateral: Secondary | ICD-10-CM | POA: Diagnosis not present

## 2023-09-19 DIAGNOSIS — Z9889 Other specified postprocedural states: Secondary | ICD-10-CM | POA: Diagnosis not present

## 2023-09-19 DIAGNOSIS — H538 Other visual disturbances: Secondary | ICD-10-CM | POA: Diagnosis not present

## 2023-12-09 ENCOUNTER — Encounter: Payer: Self-pay | Admitting: Family Medicine

## 2024-02-11 DIAGNOSIS — L578 Other skin changes due to chronic exposure to nonionizing radiation: Secondary | ICD-10-CM | POA: Diagnosis not present

## 2024-02-11 DIAGNOSIS — D224 Melanocytic nevi of scalp and neck: Secondary | ICD-10-CM | POA: Diagnosis not present

## 2024-02-11 DIAGNOSIS — L821 Other seborrheic keratosis: Secondary | ICD-10-CM | POA: Diagnosis not present

## 2024-03-27 ENCOUNTER — Ambulatory Visit: Admitting: Family Medicine

## 2024-03-27 ENCOUNTER — Encounter: Payer: Self-pay | Admitting: Family Medicine

## 2024-03-27 VITALS — BP 166/96 | HR 83 | Resp 16 | Ht 67.0 in | Wt 211.0 lb

## 2024-03-27 DIAGNOSIS — I10 Essential (primary) hypertension: Secondary | ICD-10-CM

## 2024-03-27 DIAGNOSIS — E7800 Pure hypercholesterolemia, unspecified: Secondary | ICD-10-CM

## 2024-03-27 DIAGNOSIS — Z125 Encounter for screening for malignant neoplasm of prostate: Secondary | ICD-10-CM

## 2024-03-27 DIAGNOSIS — Z23 Encounter for immunization: Secondary | ICD-10-CM

## 2024-03-27 DIAGNOSIS — Z1159 Encounter for screening for other viral diseases: Secondary | ICD-10-CM

## 2024-03-27 MED ORDER — ROSUVASTATIN CALCIUM 10 MG PO TABS: 10.0000 mg | ORAL_TABLET | Freq: Every day | ORAL | 3 refills | Status: AC

## 2024-03-27 MED ORDER — AMLODIPINE BESYLATE 5 MG PO TABS: 5.0000 mg | ORAL_TABLET | Freq: Every day | ORAL | 0 refills | Status: AC

## 2024-03-27 NOTE — Progress Notes (Signed)
 "     Established patient visit   Patient: Alex Patterson   DOB: [birth year]   64 y.o. Male  MRN: [id number] Visit Date: 03/27/2024  Today's healthcare provider: Nancyann Perry, MD   Chief Complaint  Patient presents with   Acute Visit   Hypertension    High BP concerns Pt states he has not had his meds in months.   Subjective    Ambient AI software was used to assist with clinical note transcription.   History of Present Illness   Alex Patterson is a 64 year old male with hypertension who presents for medication refill and management of high blood pressure.  He has been experiencing elevated blood pressure readings, with a recent measurement of 166/96 mmHg. He has been out of his antihypertensive medication, amlodipine , for a couple of weeks due to a lapse in mail-order refills. Previously, he was on multiple antihypertensive medications but had tapered down to just amlodipine . During a recent urgent care visit, his blood pressure was even higher, which he attributes to lack of sleep and illness.  He has also been out of his statin medication for a significant period, though he cannot recall exactly how long. He is concerned about his weight, which has [place] had managed to [place] it significantly. He attributes part of the weight gain to a 'diet of convenience' due to a kitchen renovation at home.  He recently experienced a cold that significantly reduced his appetite, but he is now almost fully recovered. During the illness, he experienced panting, which he attributes to the virus. He denies chest pain or heart flutters. He continues to engage in physical activity, primarily weightlifting, and is exploring cardio options.  He mentions occasional post-urination leakage, which he noticed after cycling, but this resolved after he stopped cycling. No swelling in his legs or feet and no trouble with breathing outside of the recent  cold.  Socially, he has been traveling extensively for work, which included frequent trips to [place] until his detail ended in August. He is now back in [place] and is considering retirement at the end of the year. His wife, Erminio, has recently started a new job at the [employer].       Medications: Show/hide medication list[1] Review of Systems  Constitutional:  Negative for appetite change, chills and fever.  Respiratory:  Negative for chest tightness, shortness of breath and wheezing.   Cardiovascular:  Negative for chest pain and palpitations.  Gastrointestinal:  Negative for abdominal pain, nausea and vomiting.       Objective    BP (!) 166/96 (BP Location: Left Arm, Patient Position: Sitting, Cuff Size: Large)   Pulse 83   Resp 16   Ht 5' 7 (1.702 m)   Wt 211 lb (95.7 kg)   SpO2 100%   BMI 33.05 kg/m   Physical Exam   General: Appearance:    Overweight male in no acute distress  Eyes:    PERRL, conjunctiva/corneas clear, EOM's intact       Lungs:     Clear to auscultation bilaterally, respirations unlabored  Heart:    Normal heart rate. Normal rhythm. No murmurs, rubs, or gallops.    MS:   All extremities are intact.    Neurologic:   Awake, alert, oriented x 3. No apparent focal neurological defect.          Assessment & Plan       Essential hypertension Blood pressure  elevated at 166/96 mmHg, likely due to recent illness and lack of medication. Previously controlled with amlodipine . - Refilled amlodipine  prescription at [facility]. - Scheduled follow-up in mid-February to reassess blood pressure and overall health.  Pure hypercholesterolemia Out of statin medication. Concerned about long-term health risks of elevated cholesterol. No recent cardiovascular symptoms. - Refilled statin prescription at [facility]. - Scheduled follow-up in mid-February to reassess cholesterol levels and overall health.     Return in about 4 weeks (around 04/24/2024) for  Hypertension.     Nancyann Perry, MD  [facility] [phone] (phone) [phone] (fax)   Medical Group     [1]  Outpatient Medications Prior to Visit  Medication Sig   aspirin  EC 81 MG tablet Take 81 mg by mouth daily.   fluticasone (FLONASE) 50 MCG/ACT nasal spray Place 2 sprays into the nose daily.   [DISCONTINUED] amLODipine  (NORVASC ) 5 MG tablet TAKE 1 TABLET DAILY   [DISCONTINUED] rosuvastatin  (CRESTOR ) 10 MG tablet Take 1 tablet (10 mg total) by mouth daily.   No facility-administered medications prior to visit.   "

## 2024-03-27 NOTE — Patient Instructions (Signed)
 SABRA  Please review the attached list of medications and notify my office if there are any errors.   . Please bring all of your medications to every appointment so we can make sure that our medication list is the same as yours.

## 2024-04-24 ENCOUNTER — Ambulatory Visit: Admitting: Family Medicine
